# Patient Record
Sex: Female | Born: 1940 | Hispanic: No | Marital: Married | Smoking: Never smoker
Health system: Southern US, Community
[De-identification: ages and names within clinical notes are randomized; demographics above are authoritative.]

## PROBLEM LIST (undated history)

## (undated) DIAGNOSIS — M1990 Unspecified osteoarthritis, unspecified site: Secondary | ICD-10-CM

## (undated) DIAGNOSIS — E119 Type 2 diabetes mellitus without complications: Secondary | ICD-10-CM

## (undated) DIAGNOSIS — Z9289 Personal history of other medical treatment: Secondary | ICD-10-CM

## (undated) DIAGNOSIS — I10 Essential (primary) hypertension: Secondary | ICD-10-CM

## (undated) DIAGNOSIS — I639 Cerebral infarction, unspecified: Secondary | ICD-10-CM

## (undated) DIAGNOSIS — I739 Peripheral vascular disease, unspecified: Secondary | ICD-10-CM

## (undated) DIAGNOSIS — E7800 Pure hypercholesterolemia, unspecified: Secondary | ICD-10-CM

## (undated) HISTORY — PX: CATARACT EXTRACTION W/ INTRAOCULAR LENS  IMPLANT, BILATERAL: SHX1307

## (undated) HISTORY — PX: CHOLECYSTECTOMY: SHX55

## (undated) HISTORY — PX: TONSILLECTOMY: SUR1361

---

## 1970-07-24 HISTORY — PX: THYROID SURGERY: SHX805

## 1996-07-24 DIAGNOSIS — I639 Cerebral infarction, unspecified: Secondary | ICD-10-CM

## 1996-07-24 HISTORY — DX: Cerebral infarction, unspecified: I63.9

## 2001-10-04 ENCOUNTER — Ambulatory Visit (HOSPITAL_COMMUNITY): Admission: RE | Admit: 2001-10-04 | Discharge: 2001-10-04 | Payer: Self-pay | Admitting: Gastroenterology

## 2001-10-15 ENCOUNTER — Encounter: Admission: RE | Admit: 2001-10-15 | Discharge: 2001-10-15 | Payer: Self-pay | Admitting: Gastroenterology

## 2001-10-15 ENCOUNTER — Encounter: Payer: Self-pay | Admitting: Gastroenterology

## 2004-05-16 ENCOUNTER — Encounter: Admission: RE | Admit: 2004-05-16 | Discharge: 2004-05-16 | Payer: Self-pay | Admitting: Gastroenterology

## 2004-05-24 ENCOUNTER — Ambulatory Visit: Payer: Self-pay | Admitting: Cardiology

## 2004-06-02 ENCOUNTER — Ambulatory Visit (HOSPITAL_COMMUNITY): Admission: RE | Admit: 2004-06-02 | Discharge: 2004-06-02 | Payer: Self-pay | Admitting: General Surgery

## 2004-06-03 ENCOUNTER — Encounter (INDEPENDENT_AMBULATORY_CARE_PROVIDER_SITE_OTHER): Payer: Self-pay | Admitting: *Deleted

## 2004-06-03 ENCOUNTER — Inpatient Hospital Stay (HOSPITAL_COMMUNITY): Admission: RE | Admit: 2004-06-03 | Discharge: 2004-06-11 | Payer: Self-pay | Admitting: General Surgery

## 2004-06-06 ENCOUNTER — Encounter: Payer: Self-pay | Admitting: Cardiology

## 2004-06-14 ENCOUNTER — Ambulatory Visit: Payer: Self-pay | Admitting: Hematology and Oncology

## 2004-06-24 ENCOUNTER — Ambulatory Visit: Payer: Self-pay | Admitting: Cardiology

## 2004-07-21 ENCOUNTER — Ambulatory Visit: Payer: Self-pay

## 2004-08-15 ENCOUNTER — Encounter: Admission: RE | Admit: 2004-08-15 | Discharge: 2004-08-15 | Payer: Self-pay | Admitting: Hematology and Oncology

## 2004-10-12 ENCOUNTER — Ambulatory Visit (HOSPITAL_COMMUNITY): Admission: RE | Admit: 2004-10-12 | Discharge: 2004-10-12 | Payer: Self-pay | Admitting: Hematology and Oncology

## 2004-10-18 ENCOUNTER — Ambulatory Visit: Payer: Self-pay | Admitting: Hematology and Oncology

## 2005-02-07 ENCOUNTER — Ambulatory Visit: Payer: Self-pay | Admitting: Hematology and Oncology

## 2005-09-26 ENCOUNTER — Ambulatory Visit: Payer: Self-pay | Admitting: Hematology and Oncology

## 2005-10-02 ENCOUNTER — Ambulatory Visit (HOSPITAL_COMMUNITY): Admission: RE | Admit: 2005-10-02 | Discharge: 2005-10-02 | Payer: Self-pay | Admitting: Hematology and Oncology

## 2006-03-29 ENCOUNTER — Ambulatory Visit: Payer: Self-pay | Admitting: Hematology and Oncology

## 2007-02-22 ENCOUNTER — Ambulatory Visit: Payer: Self-pay | Admitting: Hematology and Oncology

## 2007-02-26 LAB — CBC WITH DIFFERENTIAL/PLATELET
BASO%: 0.5 % (ref 0.0–2.0)
HCT: 32.6 % — ABNORMAL LOW (ref 34.8–46.6)
HGB: 11.1 g/dL — ABNORMAL LOW (ref 11.6–15.9)
MCH: 27 pg (ref 26.0–34.0)
MCHC: 34.1 g/dL (ref 32.0–36.0)
MCV: 79.2 fL — ABNORMAL LOW (ref 81.0–101.0)
MONO%: 7.4 % (ref 0.0–13.0)
NEUT%: 56.3 % (ref 39.6–76.8)
RDW: 13.6 % (ref 11.3–14.5)
WBC: 6.9 10*3/uL (ref 3.9–10.0)

## 2007-02-26 LAB — COMPREHENSIVE METABOLIC PANEL
Creatinine, Ser: 0.81 mg/dL (ref 0.40–1.20)
Glucose, Bld: 149 mg/dL — ABNORMAL HIGH (ref 70–99)

## 2007-02-26 LAB — CEA: CEA: 1.1 ng/mL (ref 0.0–5.0)

## 2007-03-05 ENCOUNTER — Ambulatory Visit (HOSPITAL_COMMUNITY): Admission: RE | Admit: 2007-03-05 | Discharge: 2007-03-05 | Payer: Self-pay | Admitting: Hematology and Oncology

## 2007-11-15 ENCOUNTER — Ambulatory Visit: Payer: Self-pay | Admitting: Hematology and Oncology

## 2010-08-14 ENCOUNTER — Encounter: Payer: Self-pay | Admitting: Hematology and Oncology

## 2010-12-09 NOTE — Consult Note (Signed)
Brianna Hendricks, Brianna Hendricks        ACCOUNT NO.:  [id number]   MEDICAL RECORD NO.:  [id number]          PATIENT TYPE:  INP   LOCATION:  0358                         FACILITY:  [facility]   PHYSICIAN:  Olga Millers, M.D. LHCDATE OF BIRTH:  10-22-1940   DATE OF CONSULTATION:  06/05/2004  DATE OF DISCHARGE:                                   CONSULTATION   REASON FOR CONSULTATION:  Brianna Hendricks is a very pleasant 70 year old female  with a past medical history of diabetes mellitus, hypertension,  hyperlipidemia, and now status post abdominal surgery for colon cancer who  we are asked to evaluate for increased heart rate and positive troponin.  The patient has no prior cardiac history and typically denies any dyspnea on  exertion, orthopnea, PND, pedal edema, palpitations, presyncope, syncope, or  chest pain.  She underwent colon resection on June 03, 2004 secondary to  colon cancer.  Earlier this morning, she had elevated heart rate to 160.  In  reviewing the strips, this appears to be sinus tachycardia versus  supraventricular tachycardia.  She was placed on Cardizem and cardiology has  been asked to evaluate.  It has also been noted that her troponin I is  elevated at 0.42.  Also of note, the patient was asymptomatic with this  elevated heart rate including no chest pain, palpitations, or shortness of  breath.   MEDICATIONS:  1.  Glipizide ER 10 mg p.o. b.i.d.  2.  Metformin 500 mg p.o. b.i.d.  3.  Carbamazepine 200 mg p.o. b.i.d.  4.  Diovan/HCT 160/12.5 mg p.o. q.a.m. and 1/2 of that q.p.m.  5.  Iron and Astor 5 mg p.o. q.d.   ALLERGIES:  She has no known drug allergies.   SOCIAL HISTORY:  She does not smoke nor does she consume alcohol.   FAMILY HISTORY:  Positive for coronary artery disease in her sister.   PAST MEDICAL HISTORY:  1.  Significant for diabetes mellitus.  2.  Hypertension.  3.  Hyperlipidemia.  4.  She does have a history of hypothyroidism.  5.  History of  seizure disorder per the notes.   PAST SURGICAL HISTORY:  She is now status post resection of a colon.   REVIEW OF SYMPTOMS:  She denies any headaches, fevers, or chills.  There is  no productive cough or hemoptysis.  There no dysphagia, odynophagia, melena,  or hematochezia.  There is no dysuria or hematuria.  There is no rash or  seizure activity.  There is no orthopnea, PND, or pedal edema.  She did feel  somewhat short of breath this morning.  She does have cramping in the lower  extremities at night but no claudication.  The remaining systems are  negative.   PHYSICAL EXAMINATION:  VITAL SIGNS:  Blood pressure of 132/55, pulse is 113.  She is afebrile, although her temperature has been 100.  GENERAL:  She is well-developed, well-nourished in no acute distress.  SKIN:  Warm and dry.  There is no peripheral clubbing.  HEENT:  Unremarkable with normal eyelids.  NECK:  Supple with normal upstroke bilaterally.  There are no bruits.  There  is  no jugular venous distention and I cannot appreciate thyromegaly.  CHEST:  Minimal basilar crackles.  CARDIOVASCULAR:  Tachycardic rate but regular rhythm.  I cannot appreciate  murmurs, gallops, or rubs.  ABDOMEN:  She is status post abdominal surgery but the wound is without  evidence of infection.  There is no hepatosplenomegaly palpated.  I can  otherwise appreciate no masses but the bowel sounds are diminished.  She has  2+ femoral pulses bilaterally.  No bruits.  EXTREMITIES:  No edema and I can palpate no cords.  She has 2+ dorsalis  pedis pulses bilaterally.  NEUROLOGICAL:  Grossly intact.   LABORATORY DATA:  Her electrocardiogram at the time of admission showed  normal sinus rhythm with no ST changes and there was minimal criteria for  LVH.  With her elevated heart rate of 160, there is sinus tachycardia versus  SVT.  There are marked ST changes in the inferolateral distribution of  approximately 2 mm to 3 mm of ST depression.  Her  white blood cell count is  14.4 with a hemoglobin of 8.4 and hematocrit 25.3.  Platelet count is  322,000.  Her sodium is 130, potassium 4, BUN 4, and creatinine 0.8.  Her CK  is 163 with a MB of 3.5.  Her troponin I is elevated at 0.42.   DIAGNOSES:  1.  Tachycardia (sinus tachycardia versus supraventricular tachycardia).  2.  Mildly elevated troponin.  3.  Diabetes mellitus.  4.  Hypertension.  5.  Hyperlipidemia.  6.  Status post resection of colon cancer.  7.  History of seizure disorder.   PLAN:  Brianna Hendricks is for evaluation of increased heart rate.  In reviewing  the strips, this appears to be question of sinus tachycardia versus  supraventricular tachycardia.  This is most likely exacerbated by decrease  in p.o. intake, abdominal pain from her recent surgery, mild fever, and  hyperadrenergic state associated with the postoperative course.  We will  plan to discontinue her Cardizem and begin Lopressor.  We will increase as  tolerated.  Her mild increase troponin is of uncertain significance.  We  will continue to cycle to look for a trend.  However, her electrocardiogram  does show marked ST changes with her increased heart rate, and therefore she  has positive stress test as well as multiple risk factors.  I therefore feel  that cardiac catheterization is indicated after she recovers from her  surgery but prior to discharge.  She will discuss this with her family and  let us know.  I will check an echocardiogram and TSH tomorrow.  I would ask  surgery to add aspirin when they feel okay from a surgical standpoint.  We  will be happy to follow.      BC/MEDQ  D:  06/05/2004  T:  06/05/2004  Job:  [id number]

## 2010-12-09 NOTE — Procedures (Signed)
[place]  Patient:    Brianna Hendricks, Brianna Hendricks Visit Number: [id number] MRN: [id number]          Service Type: END Location: ENDO Attending Physician:  Charna Elizabeth Dictated by:   Anselmo Rod, M.D. Proc. Date: 10/04/01 Admit Date:  10/04/2001   CC:         Doreen Beam, M.D., [place], [place]   Procedure Report  DATE OF BIRTH:  [birth year]  PROCEDURE PERFORMED:  Colonoscopy.  ENDOSCOPIST:  Anselmo Rod, M.D.  INSTRUMENT USED:  Pediatric adjustable Olympus colonoscope.  INDICATION FOR PROCEDURE:  Iron deficiency anemia in a 70 year old [place] female; rule out colonic polyps, masses, hemorrhoids, etc.  Her EGD done earlier today was unrevealing.  PREPROCEDURE PREPARATION:  Informed consent was procured from the patient. The patient was fasted for eight hours prior to the procedure and prepped with a bottle of magnesium citrate and a gallon of NuLytely the night prior to the procedure.  PREPROCEDURE PHYSICAL:  VITAL SIGNS:  Patient had stable vital signs.  NECK:  Supple.  CHEST:  Clear to auscultation.  S1 and S2 regular.  ABDOMEN:  Soft with normal bowel sounds.  DESCRIPTION OF THE PROCEDURE:  The patient was placed in the left lateral decubitus position and sedated with Demerol and Versed for the EGD; no additional sedation was used for the colonoscopy.  Once the patient was adequately positioned and maintained on low-flow oxygen and continuous cardiac monitoring, the Olympus video colonoscope was advanced from the rectum to the cecum without difficulty.  The entire colonic mucosa appeared healthy with a normal vascular pattern.  No erosions, ulcerations, masses or polyps were seen.  The procedure completed up to the cecum.  The appendiceal orifice and ileocecal valve were clearly visualized and photographed.  IMPRESSION:  Normal-appearing colon.  RECOMMENDATION:  A small-bowel follow-through will be done if the patient  is significantly iron deficient, if not, iron supplementation is recommended with serial CBCs.Dictated by:   Anselmo Rod, M.D. Attending Physician:  Charna Elizabeth DD:  10/04/01 TD:  10/07/01 Job: [id number]

## 2010-12-09 NOTE — Op Note (Signed)
Brianna Hendricks, Brianna Hendricks        ACCOUNT NO.:  [id number]   MEDICAL RECORD NO.:  [id number]          PATIENT TYPE:  INP   LOCATION:  0472                         FACILITY:  [facility]   PHYSICIAN:  Jimmye Norman III, M.D.  DATE OF BIRTH:  June 29, 1941   DATE OF PROCEDURE:  06/03/2004  DATE OF DISCHARGE:                                 OPERATIVE REPORT   PREOPERATIVE DIAGNOSIS:  Right colon cancer.   POSTOPERATIVE DIAGNOSIS:  Right colon cancer.   PROCEDURE:  Right hemicolectomy.   SURGEON:  Jimmye Norman, M.D.   ASSISTANT:  Currie Paris, M.D.   ANESTHESIA:  General endotracheal.   ESTIMATED BLOOD LOSS:  100 cc.   COMPLICATIONS:  None.   CONDITION:  Stable.   INDICATIONS FOR PROCEDURE:  The patient is a 70 year old with recently  diagnosed anemia of unknown etiology until a colonoscopy was done,  demonstrating a nearly circumferential, constricting lesion of the right  colon, who now comes in for colectomy.   FINDINGS:  The patient had a constricting lesion of the hepatic flexure.  There was no evidence of direct invasion of local strictures or of  metastasis to the liver or spleen or other bowel structures.  The patient  did have some adhesions from previous surgery with small bowel into the  pelvis, which was easily removed.  She had a CT scan which demonstrated  possibly a small gallstones; however, none was palpated at the time of  surgery, and the gallbladder looked normal and therefore not removed.   OPERATION:  The patient was taken to the operating room and placed on the  table in the supine position.  After an adequate endotracheal anesthetic was  administered, she was prepped and draped in the usual sterile manner,  exposing the midline.   We started off with the midline incision just above the umbilicus and down  below the umbilicus to almost the pubic crest.  We extended it more  cranially after not having adequate exposure with the initial fascial  incision.  We then entered down to the midline fascia using electrocautery  and then into the peritoneal cavity using electrocautery through the fascia.  Once this was opened, we extended the fascia to the full extent of the skin  incision, placed a Balfour retractor in place and placed the patient in  slight reverse Trendelenburg with the left side tilted down.   Upon manual exploration, you could palpate the constricting lesion of the  hepatic flexure.  There was some dye into the wall of the serosa where it  had been injected to identify the actual lesion.  The right colon was  attached in its usual manner to the right paracolic area, and we took the  right colon down from the line of Toldt and mobilized it medially.  The  attachments towards the hepatic flexure were taken down between Kelly clamps  and 2-0 silk ties.  We reflected the colon downward medially by taking the  omentum down also and then transecting the colon just distal to the middle  colic vessels using a GIA-75 stapler.  We were able to take the mesentery  between [place] clamps and 2-0 silk ties with the larger vessels being doubly  tied.  The terminal ileum was transected with the GIA-75 stapler.  We did  have to mobilize adhesions of the small bowel to the pelvis prior to  transecting it; however, there was no injury and the anastomosis  subsequently was without event.   We stayed very close to the colon as we mobilized the right colon medially,  staying away from the retroperitoneal structures.  We irrigated after doing  our resection and anastomosis.  Again, the mesentery was taken between [place]  clamps and 2-0 silk ties.  Once this was done, we transected the bowel and  removed the specimen.   We did a side-to-side anastomosis between the terminal ileum and the left  transverse colon using a GIA-75 stapler.  A TA 60 was used to close the  resulting enterotomy, and the mesentery was closed using interrupted 2-0   silk sutures.  Once this was done, we changed the surgeon's gloves and then  irrigated with saline.  About 2 liters were used.  There was minimal  bleeding, although from the staple line, there was a small amount of oozing  on the external surface.  Internally, it did not ooze after the anastomosis  was done.   Once we had irrigated with saline, we ran the small bowel from the  anastomosis back to the ligament of Treitz and found there to be no evidence  of twisting.  We inspected, again, the area of the liver, at the  gallbladder, spleen, and stomach, and found no evidence of metastasis.  We  placed the omentum and the small bowel back into the abdominal cavity and  then closed the fascia using a running #1 PDS suture.  The skin, after  irrigating the subcu, was closed using stainless steel staples.  All needle  counts, instrument counts, and sponge counts were correct, and sterile  dressings were applied.      JW/MEDQ  D:  06/03/2004  T:  06/03/2004  Job:  [id number]

## 2010-12-09 NOTE — Discharge Summary (Signed)
Brianna Hendricks, ZEISER        ACCOUNT NO.:  [id number]   MEDICAL RECORD NO.:  [id number]          PATIENT TYPE:  INP   LOCATION:  0358                         [facility]ACILITY:  [facility]   PHYSICIAN:  Jimmye Norman III, M.D.  DATE OF BIRTH:  08-Feb-1941   DATE OF ADMISSION:  06/03/2004  DATE OF DISCHARGE:  06/11/2004                                 DISCHARGE SUMMARY   DISCHARGE DIAGNOSES:  1.  Adenocarcinoma of ascending colon.  2.  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  3.  Noninsulin dependent diabetes mellitus.  4.  Hypertension.   PRINCIPAL PROCEDURE:  1.  Right hemicolectomy.  2.  Cardiac catheterization with procedure being dictated by the      cardiologist, Dr. Antoine Poche.   DIET:  Regular.   CONDITION ON DISCHARGE:  Stable.  She did receive an oncology consultation  at follow-up also.   HOSPITAL COURSE:  The patient was admitted the day of her surgery, June 03, 2004, after an outpatient bowel prep.  She underwent a right  hemicolectomy.  Postoperatively that evening she did well; however, on  postoperative day #2 she had the acute onset of mild shortness of breath  with some tachycardia and she was transferred to a monitor unit and a  cardiology consultation was obtained for a heart rate up to 120-130.  She  had no shortness of breath or chest pain at that time; however, her  troponins did bump up slightly.  The CKs and MBs were negative.  She was  thought to have some possible mild ischemia.   She converted and came down with a Cardizem drip while on the monitor unit.  She was started on some additional medications to control her heart rate by  cardiology, including Lopressor 25 mg p.o. four times a day.  She was given  some Lasix.  Her preoperative medications were started back on postoperative  day #3.  At that time her abdomen was soft and nontender and she was  actually tolerating a clear liquid diet well.  She was advanced to full  liquid on postoperative day #5 and this was subsequently increased to a soft  diet by the time of discharge.   FOLLOW UP:  Per cardiology and by me was to take place within two weeks.  I  will see the patient on June 14, 2004.  She also had an appointment to  see the cardiologist and to get a stress test on December 29 at 9 a.m.      JW/MEDQ  D:  06/30/2004  T:  06/30/2004  Job:  [id number]

## 2010-12-09 NOTE — Cardiovascular Report (Signed)
Brianna Hendricks, Brianna Hendricks        ACCOUNT NO.:  [id number]   MEDICAL RECORD NO.:  [id number]          PATIENT TYPE:  OUT   LOCATION:  CATH                         FACILITY:  [facility]   PHYSICIAN:  Rollene Rotunda, M.D.   DATE OF BIRTH:  27-Jul-1940   DATE OF PROCEDURE:  06/09/2004  DATE OF DISCHARGE:                              CARDIAC CATHETERIZATION   PRIMARY CARE PHYSICIAN:  Dr. Doreen Beam   PROCEDURE:  Left heart catheterization/coronary arteriography.   INDICATIONS:  Evaluate patient with supraventricular tachycardia with ST  segment depression in infero and lateral leads with a slight troponin  elevation.   PROCEDURE NOTE:  Left heart catheterization was performed the via the right  femoral artery.  The artery was cannulated using anterior wall puncture.  A  #6-French arterial sheath was inserted via the modified Seldinger technique.  Preformed Judkins and a pigtail catheter were utilized.  The patient  tolerated procedure well and left the laboratory in stable condition.   RESULTS:  HEMODYNAMICS:  LV 191/21.  AO 189/81.   CORONARIES:  The left main was normal.   The LAD had a proximal 60-70% followed by mid 40% stenosis at a first septal  perforator.  The diagonal arising from this area was somewhat small and had  ostial 70-80% stenosis.  Second diagonal was moderate size with luminal  irregularities.   The circumflex in the AV groove had mid tandem 30% lesions.   The right coronary artery was a dominant vessel.  There were luminal  irregularities.  A PDA was moderate size and normal.   LEFT VENTRICULOGRAM:  The left ventriculogram was obtained in the RAO  projection.  The EF was 65% with normal wall motion.   CONCLUSION:  Patient has moderate left anterior descending stenosis.  There  is higher grade diagonal stenosis.  However, this is ostial in a somewhat  small vessel.  At this point I would manage this medically.  When she has  recovered from abdominal surgery I  would follow this with a stress perfusion  study.  If she has any symptoms going forward, would consider percutaneous  revascularization of the left anterior descending plus or minus the  diagonal.  She should have aggressive secondary risk reduction.       JH/MEDQ  D:  06/09/2004  T:  06/10/2004  Job:  [id number]   cc:   Doreen Beam  [address]  [place]  [place] [address]  Fax: [phone]   Anselmo Rod, M.D.  [address]  [address]  [place]  [place] [address]  Fax: [phone]

## 2010-12-09 NOTE — Procedures (Signed)
[place]. [facility]  Patient:    Brianna Hendricks, NUNCIO Visit Number: [id number] MRN: [id number]          Service Type: END Location: ENDO Attending Physician:  Charna Elizabeth Dictated by:   Anselmo Rod, M.D. Proc. Date: 10/04/01 Admit Date:  10/04/2001   CC:         Doreen Beam, M.D., [place], [place]   Procedure Report  DATE OF BIRTH:  1941/03/11.  PROCEDURE:  Esophagogastroduodenoscopy.  ENDOSCOPIST:  Anselmo Rod, M.D.  INSTRUMENT USED:  Olympus video panendoscope.  INDICATION FOR PROCEDURE:  Iron deficiency anemia in a 70 year old [place] female who has a history of diabetes.  Rule out peptic ulcer disease, esophagitis, gastritis, etc.  PREPROCEDURE PREPARATION:  Informed consent was procured from the patient. The patient was fasted for eight hours prior to the procedure.  PREPROCEDURE PHYSICAL:  VITAL SIGNS:  The patient had stable vital signs.  NECK:  Supple.  CHEST:  Clear to auscultation.  S1, S2 regular.  ABDOMEN:  Soft with normal bowel sounds.  DESCRIPTION OF PROCEDURE:  The patient was placed in the left lateral decubitus position and sedated with 80 mg of Demerol and 6 mg of Versed intravenously.  Once the patient was adequately sedate and maintained on low-flow oxygen and continuous cardiac monitoring, the Olympus video panendoscope was advanced through the mouthpiece, over the tongue, into the esophagus under direct vision.  The entire esophagus appeared normal and without lesions.  The scope was then advanced into the stomach.  The entire gastric mucosa and the proximal small bowel appeared normal.  IMPRESSION:  Normal EGD.  RECOMMENDATIONS:  Proceed with a colonoscopy at this time. Dictated by:   Anselmo Rod, M.D. Attending Physician:  Charna Elizabeth DD:  10/04/01 TD:  10/07/01 Job: [id number]

## 2012-04-11 ENCOUNTER — Other Ambulatory Visit: Payer: Self-pay | Admitting: Internal Medicine

## 2012-04-11 DIAGNOSIS — Z1231 Encounter for screening mammogram for malignant neoplasm of breast: Secondary | ICD-10-CM

## 2012-04-18 ENCOUNTER — Ambulatory Visit
Admission: RE | Admit: 2012-04-18 | Discharge: 2012-04-18 | Disposition: A | Discharge status: Home / Self Care | Payer: Medicare Other | Source: Ambulatory Visit | Attending: Internal Medicine | Admitting: Internal Medicine

## 2012-04-18 DIAGNOSIS — Z1231 Encounter for screening mammogram for malignant neoplasm of breast: Secondary | ICD-10-CM

## 2013-07-01 ENCOUNTER — Other Ambulatory Visit: Payer: Self-pay

## 2013-07-01 DIAGNOSIS — Z1231 Encounter for screening mammogram for malignant neoplasm of breast: Secondary | ICD-10-CM

## 2013-08-06 ENCOUNTER — Ambulatory Visit
Admission: RE | Admit: 2013-08-06 | Discharge: 2013-08-06 | Disposition: A | Discharge status: Home / Self Care | Payer: Medicare Other | Source: Ambulatory Visit

## 2013-08-06 DIAGNOSIS — Z1231 Encounter for screening mammogram for malignant neoplasm of breast: Secondary | ICD-10-CM

## 2015-04-09 ENCOUNTER — Other Ambulatory Visit: Payer: Self-pay

## 2015-04-09 DIAGNOSIS — Z1231 Encounter for screening mammogram for malignant neoplasm of breast: Secondary | ICD-10-CM

## 2015-04-13 ENCOUNTER — Ambulatory Visit: Payer: Medicare Other

## 2015-04-13 ENCOUNTER — Ambulatory Visit
Admission: RE | Admit: 2015-04-13 | Discharge: 2015-04-13 | Disposition: A | Discharge status: Home / Self Care | Payer: Medicare Other | Source: Ambulatory Visit

## 2015-04-13 DIAGNOSIS — Z1231 Encounter for screening mammogram for malignant neoplasm of breast: Secondary | ICD-10-CM

## 2015-08-24 DIAGNOSIS — D509 Iron deficiency anemia, unspecified: Secondary | ICD-10-CM | POA: Diagnosis not present

## 2015-08-24 DIAGNOSIS — M1990 Unspecified osteoarthritis, unspecified site: Secondary | ICD-10-CM | POA: Diagnosis not present

## 2015-08-24 DIAGNOSIS — E1165 Type 2 diabetes mellitus with hyperglycemia: Secondary | ICD-10-CM | POA: Diagnosis not present

## 2015-08-24 DIAGNOSIS — Z6820 Body mass index (BMI) 20.0-20.9, adult: Secondary | ICD-10-CM | POA: Diagnosis not present

## 2015-08-24 DIAGNOSIS — E7800 Pure hypercholesterolemia, unspecified: Secondary | ICD-10-CM | POA: Diagnosis not present

## 2015-10-27 DIAGNOSIS — M159 Polyosteoarthritis, unspecified: Secondary | ICD-10-CM | POA: Diagnosis not present

## 2015-10-27 DIAGNOSIS — E7800 Pure hypercholesterolemia, unspecified: Secondary | ICD-10-CM | POA: Diagnosis not present

## 2015-10-27 DIAGNOSIS — I10 Essential (primary) hypertension: Secondary | ICD-10-CM | POA: Diagnosis not present

## 2015-10-27 DIAGNOSIS — E119 Type 2 diabetes mellitus without complications: Secondary | ICD-10-CM | POA: Diagnosis not present

## 2015-11-01 DIAGNOSIS — R5383 Other fatigue: Secondary | ICD-10-CM | POA: Diagnosis not present

## 2015-11-01 DIAGNOSIS — Z79899 Other long term (current) drug therapy: Secondary | ICD-10-CM | POA: Diagnosis not present

## 2015-11-01 DIAGNOSIS — Z7189 Other specified counseling: Secondary | ICD-10-CM | POA: Diagnosis not present

## 2015-11-01 DIAGNOSIS — R0789 Other chest pain: Secondary | ICD-10-CM | POA: Diagnosis not present

## 2015-11-01 DIAGNOSIS — Z299 Encounter for prophylactic measures, unspecified: Secondary | ICD-10-CM | POA: Diagnosis not present

## 2015-11-01 DIAGNOSIS — E7800 Pure hypercholesterolemia, unspecified: Secondary | ICD-10-CM | POA: Diagnosis not present

## 2015-11-01 DIAGNOSIS — Z0000 Encounter for general adult medical examination without abnormal findings: Secondary | ICD-10-CM | POA: Diagnosis not present

## 2015-11-01 DIAGNOSIS — Z1389 Encounter for screening for other disorder: Secondary | ICD-10-CM | POA: Diagnosis not present

## 2015-11-23 DIAGNOSIS — E1165 Type 2 diabetes mellitus with hyperglycemia: Secondary | ICD-10-CM | POA: Diagnosis not present

## 2015-11-23 DIAGNOSIS — Z789 Other specified health status: Secondary | ICD-10-CM | POA: Diagnosis not present

## 2015-11-23 DIAGNOSIS — R252 Cramp and spasm: Secondary | ICD-10-CM | POA: Diagnosis not present

## 2015-11-23 DIAGNOSIS — M545 Low back pain: Secondary | ICD-10-CM | POA: Diagnosis not present

## 2015-11-23 DIAGNOSIS — D509 Iron deficiency anemia, unspecified: Secondary | ICD-10-CM | POA: Diagnosis not present

## 2015-12-10 DIAGNOSIS — Z961 Presence of intraocular lens: Secondary | ICD-10-CM | POA: Diagnosis not present

## 2015-12-10 DIAGNOSIS — H31092 Other chorioretinal scars, left eye: Secondary | ICD-10-CM | POA: Diagnosis not present

## 2015-12-10 DIAGNOSIS — H1851 Endothelial corneal dystrophy: Secondary | ICD-10-CM | POA: Diagnosis not present

## 2015-12-10 DIAGNOSIS — E113293 Type 2 diabetes mellitus with mild nonproliferative diabetic retinopathy without macular edema, bilateral: Secondary | ICD-10-CM | POA: Diagnosis not present

## 2016-01-19 DIAGNOSIS — E119 Type 2 diabetes mellitus without complications: Secondary | ICD-10-CM | POA: Diagnosis not present

## 2016-01-19 DIAGNOSIS — E7800 Pure hypercholesterolemia, unspecified: Secondary | ICD-10-CM | POA: Diagnosis not present

## 2016-01-19 DIAGNOSIS — I10 Essential (primary) hypertension: Secondary | ICD-10-CM | POA: Diagnosis not present

## 2016-01-19 DIAGNOSIS — M159 Polyosteoarthritis, unspecified: Secondary | ICD-10-CM | POA: Diagnosis not present

## 2016-02-08 DIAGNOSIS — M159 Polyosteoarthritis, unspecified: Secondary | ICD-10-CM | POA: Diagnosis not present

## 2016-02-08 DIAGNOSIS — E119 Type 2 diabetes mellitus without complications: Secondary | ICD-10-CM | POA: Diagnosis not present

## 2016-02-08 DIAGNOSIS — E7800 Pure hypercholesterolemia, unspecified: Secondary | ICD-10-CM | POA: Diagnosis not present

## 2016-02-08 DIAGNOSIS — I10 Essential (primary) hypertension: Secondary | ICD-10-CM | POA: Diagnosis not present

## 2016-02-29 DIAGNOSIS — E1165 Type 2 diabetes mellitus with hyperglycemia: Secondary | ICD-10-CM | POA: Diagnosis not present

## 2016-02-29 DIAGNOSIS — E7800 Pure hypercholesterolemia, unspecified: Secondary | ICD-10-CM | POA: Diagnosis not present

## 2016-02-29 DIAGNOSIS — I10 Essential (primary) hypertension: Secondary | ICD-10-CM | POA: Diagnosis not present

## 2016-02-29 DIAGNOSIS — E1159 Type 2 diabetes mellitus with other circulatory complications: Secondary | ICD-10-CM | POA: Diagnosis not present

## 2016-06-06 DIAGNOSIS — Z713 Dietary counseling and surveillance: Secondary | ICD-10-CM | POA: Diagnosis not present

## 2016-06-06 DIAGNOSIS — Z6821 Body mass index (BMI) 21.0-21.9, adult: Secondary | ICD-10-CM | POA: Diagnosis not present

## 2016-06-06 DIAGNOSIS — I10 Essential (primary) hypertension: Secondary | ICD-10-CM | POA: Diagnosis not present

## 2016-06-06 DIAGNOSIS — E1165 Type 2 diabetes mellitus with hyperglycemia: Secondary | ICD-10-CM | POA: Diagnosis not present

## 2016-06-06 DIAGNOSIS — Z299 Encounter for prophylactic measures, unspecified: Secondary | ICD-10-CM | POA: Diagnosis not present

## 2016-07-14 DIAGNOSIS — E1165 Type 2 diabetes mellitus with hyperglycemia: Secondary | ICD-10-CM | POA: Diagnosis not present

## 2016-07-14 DIAGNOSIS — I10 Essential (primary) hypertension: Secondary | ICD-10-CM | POA: Diagnosis not present

## 2016-08-08 DIAGNOSIS — E1165 Type 2 diabetes mellitus with hyperglycemia: Secondary | ICD-10-CM | POA: Diagnosis not present

## 2016-08-08 DIAGNOSIS — Z299 Encounter for prophylactic measures, unspecified: Secondary | ICD-10-CM | POA: Diagnosis not present

## 2016-08-08 DIAGNOSIS — Z6821 Body mass index (BMI) 21.0-21.9, adult: Secondary | ICD-10-CM | POA: Diagnosis not present

## 2016-08-08 DIAGNOSIS — I10 Essential (primary) hypertension: Secondary | ICD-10-CM | POA: Diagnosis not present

## 2016-08-08 DIAGNOSIS — Z713 Dietary counseling and surveillance: Secondary | ICD-10-CM | POA: Diagnosis not present

## 2016-08-23 DIAGNOSIS — E7800 Pure hypercholesterolemia, unspecified: Secondary | ICD-10-CM | POA: Diagnosis not present

## 2016-08-23 DIAGNOSIS — E119 Type 2 diabetes mellitus without complications: Secondary | ICD-10-CM | POA: Diagnosis not present

## 2016-08-23 DIAGNOSIS — I10 Essential (primary) hypertension: Secondary | ICD-10-CM | POA: Diagnosis not present

## 2016-08-23 DIAGNOSIS — M159 Polyosteoarthritis, unspecified: Secondary | ICD-10-CM | POA: Diagnosis not present

## 2016-10-19 DIAGNOSIS — M159 Polyosteoarthritis, unspecified: Secondary | ICD-10-CM | POA: Diagnosis not present

## 2016-10-19 DIAGNOSIS — I10 Essential (primary) hypertension: Secondary | ICD-10-CM | POA: Diagnosis not present

## 2016-10-19 DIAGNOSIS — E7800 Pure hypercholesterolemia, unspecified: Secondary | ICD-10-CM | POA: Diagnosis not present

## 2016-10-19 DIAGNOSIS — E119 Type 2 diabetes mellitus without complications: Secondary | ICD-10-CM | POA: Diagnosis not present

## 2016-11-07 DIAGNOSIS — M1990 Unspecified osteoarthritis, unspecified site: Secondary | ICD-10-CM | POA: Diagnosis not present

## 2016-11-07 DIAGNOSIS — E1165 Type 2 diabetes mellitus with hyperglycemia: Secondary | ICD-10-CM | POA: Diagnosis not present

## 2016-11-07 DIAGNOSIS — I10 Essential (primary) hypertension: Secondary | ICD-10-CM | POA: Diagnosis not present

## 2016-11-07 DIAGNOSIS — Z299 Encounter for prophylactic measures, unspecified: Secondary | ICD-10-CM | POA: Diagnosis not present

## 2016-11-07 DIAGNOSIS — Z6821 Body mass index (BMI) 21.0-21.9, adult: Secondary | ICD-10-CM | POA: Diagnosis not present

## 2016-12-15 DIAGNOSIS — H1851 Endothelial corneal dystrophy: Secondary | ICD-10-CM | POA: Diagnosis not present

## 2016-12-15 DIAGNOSIS — H31092 Other chorioretinal scars, left eye: Secondary | ICD-10-CM | POA: Diagnosis not present

## 2016-12-15 DIAGNOSIS — Z961 Presence of intraocular lens: Secondary | ICD-10-CM | POA: Diagnosis not present

## 2016-12-15 DIAGNOSIS — E113293 Type 2 diabetes mellitus with mild nonproliferative diabetic retinopathy without macular edema, bilateral: Secondary | ICD-10-CM | POA: Diagnosis not present

## 2016-12-20 DIAGNOSIS — Z0000 Encounter for general adult medical examination without abnormal findings: Secondary | ICD-10-CM | POA: Diagnosis not present

## 2016-12-20 DIAGNOSIS — E049 Nontoxic goiter, unspecified: Secondary | ICD-10-CM | POA: Diagnosis not present

## 2016-12-20 DIAGNOSIS — E1165 Type 2 diabetes mellitus with hyperglycemia: Secondary | ICD-10-CM | POA: Diagnosis not present

## 2016-12-20 DIAGNOSIS — Z1211 Encounter for screening for malignant neoplasm of colon: Secondary | ICD-10-CM | POA: Diagnosis not present

## 2016-12-20 DIAGNOSIS — Z6821 Body mass index (BMI) 21.0-21.9, adult: Secondary | ICD-10-CM | POA: Diagnosis not present

## 2016-12-20 DIAGNOSIS — Z299 Encounter for prophylactic measures, unspecified: Secondary | ICD-10-CM | POA: Diagnosis not present

## 2016-12-20 DIAGNOSIS — I10 Essential (primary) hypertension: Secondary | ICD-10-CM | POA: Diagnosis not present

## 2016-12-20 DIAGNOSIS — Z79899 Other long term (current) drug therapy: Secondary | ICD-10-CM | POA: Diagnosis not present

## 2016-12-20 DIAGNOSIS — Z7189 Other specified counseling: Secondary | ICD-10-CM | POA: Diagnosis not present

## 2016-12-20 DIAGNOSIS — E8940 Asymptomatic postprocedural ovarian failure: Secondary | ICD-10-CM | POA: Diagnosis not present

## 2016-12-20 DIAGNOSIS — R5383 Other fatigue: Secondary | ICD-10-CM | POA: Diagnosis not present

## 2016-12-20 DIAGNOSIS — Z1389 Encounter for screening for other disorder: Secondary | ICD-10-CM | POA: Diagnosis not present

## 2016-12-20 DIAGNOSIS — E7800 Pure hypercholesterolemia, unspecified: Secondary | ICD-10-CM | POA: Diagnosis not present

## 2017-01-05 DIAGNOSIS — I10 Essential (primary) hypertension: Secondary | ICD-10-CM | POA: Diagnosis not present

## 2017-01-05 DIAGNOSIS — R5383 Other fatigue: Secondary | ICD-10-CM | POA: Diagnosis not present

## 2017-01-05 DIAGNOSIS — D509 Iron deficiency anemia, unspecified: Secondary | ICD-10-CM | POA: Diagnosis not present

## 2017-01-05 DIAGNOSIS — Z6821 Body mass index (BMI) 21.0-21.9, adult: Secondary | ICD-10-CM | POA: Diagnosis not present

## 2017-01-05 DIAGNOSIS — Z79899 Other long term (current) drug therapy: Secondary | ICD-10-CM | POA: Diagnosis not present

## 2017-01-05 DIAGNOSIS — D500 Iron deficiency anemia secondary to blood loss (chronic): Secondary | ICD-10-CM | POA: Diagnosis not present

## 2017-01-05 DIAGNOSIS — Z299 Encounter for prophylactic measures, unspecified: Secondary | ICD-10-CM | POA: Diagnosis not present

## 2017-01-05 DIAGNOSIS — Z713 Dietary counseling and surveillance: Secondary | ICD-10-CM | POA: Diagnosis not present

## 2017-01-05 DIAGNOSIS — R42 Dizziness and giddiness: Secondary | ICD-10-CM | POA: Diagnosis not present

## 2017-01-05 DIAGNOSIS — E1165 Type 2 diabetes mellitus with hyperglycemia: Secondary | ICD-10-CM | POA: Diagnosis not present

## 2017-01-05 DIAGNOSIS — M1990 Unspecified osteoarthritis, unspecified site: Secondary | ICD-10-CM | POA: Diagnosis not present

## 2017-03-28 DIAGNOSIS — Z6820 Body mass index (BMI) 20.0-20.9, adult: Secondary | ICD-10-CM | POA: Diagnosis not present

## 2017-03-28 DIAGNOSIS — E7800 Pure hypercholesterolemia, unspecified: Secondary | ICD-10-CM | POA: Diagnosis not present

## 2017-03-28 DIAGNOSIS — Z299 Encounter for prophylactic measures, unspecified: Secondary | ICD-10-CM | POA: Diagnosis not present

## 2017-03-28 DIAGNOSIS — E1165 Type 2 diabetes mellitus with hyperglycemia: Secondary | ICD-10-CM | POA: Diagnosis not present

## 2017-03-28 DIAGNOSIS — I10 Essential (primary) hypertension: Secondary | ICD-10-CM | POA: Diagnosis not present

## 2017-07-09 DIAGNOSIS — E1165 Type 2 diabetes mellitus with hyperglycemia: Secondary | ICD-10-CM | POA: Diagnosis not present

## 2017-07-09 DIAGNOSIS — Z789 Other specified health status: Secondary | ICD-10-CM | POA: Diagnosis not present

## 2017-07-09 DIAGNOSIS — Z299 Encounter for prophylactic measures, unspecified: Secondary | ICD-10-CM | POA: Diagnosis not present

## 2017-07-09 DIAGNOSIS — Z6820 Body mass index (BMI) 20.0-20.9, adult: Secondary | ICD-10-CM | POA: Diagnosis not present

## 2017-07-09 DIAGNOSIS — I10 Essential (primary) hypertension: Secondary | ICD-10-CM | POA: Diagnosis not present

## 2017-07-09 DIAGNOSIS — Z713 Dietary counseling and surveillance: Secondary | ICD-10-CM | POA: Diagnosis not present

## 2017-11-06 DIAGNOSIS — I10 Essential (primary) hypertension: Secondary | ICD-10-CM | POA: Diagnosis not present

## 2017-11-06 DIAGNOSIS — Z6820 Body mass index (BMI) 20.0-20.9, adult: Secondary | ICD-10-CM | POA: Diagnosis not present

## 2017-11-06 DIAGNOSIS — D509 Iron deficiency anemia, unspecified: Secondary | ICD-10-CM | POA: Diagnosis not present

## 2017-11-06 DIAGNOSIS — M79605 Pain in left leg: Secondary | ICD-10-CM | POA: Diagnosis not present

## 2017-11-06 DIAGNOSIS — E1165 Type 2 diabetes mellitus with hyperglycemia: Secondary | ICD-10-CM | POA: Diagnosis not present

## 2017-11-06 DIAGNOSIS — Z299 Encounter for prophylactic measures, unspecified: Secondary | ICD-10-CM | POA: Diagnosis not present

## 2017-11-19 DIAGNOSIS — I70211 Atherosclerosis of native arteries of extremities with intermittent claudication, right leg: Secondary | ICD-10-CM | POA: Diagnosis not present

## 2017-11-19 DIAGNOSIS — M79605 Pain in left leg: Secondary | ICD-10-CM | POA: Diagnosis not present

## 2017-11-29 DIAGNOSIS — I447 Left bundle-branch block, unspecified: Secondary | ICD-10-CM | POA: Diagnosis not present

## 2017-11-29 DIAGNOSIS — I739 Peripheral vascular disease, unspecified: Secondary | ICD-10-CM | POA: Diagnosis not present

## 2017-11-29 DIAGNOSIS — E1151 Type 2 diabetes mellitus with diabetic peripheral angiopathy without gangrene: Secondary | ICD-10-CM | POA: Diagnosis not present

## 2017-11-29 DIAGNOSIS — E782 Mixed hyperlipidemia: Secondary | ICD-10-CM | POA: Diagnosis not present

## 2017-12-12 DIAGNOSIS — I739 Peripheral vascular disease, unspecified: Secondary | ICD-10-CM | POA: Diagnosis not present

## 2017-12-19 DIAGNOSIS — H1851 Endothelial corneal dystrophy: Secondary | ICD-10-CM | POA: Diagnosis not present

## 2017-12-19 DIAGNOSIS — H31092 Other chorioretinal scars, left eye: Secondary | ICD-10-CM | POA: Diagnosis not present

## 2017-12-19 DIAGNOSIS — E113293 Type 2 diabetes mellitus with mild nonproliferative diabetic retinopathy without macular edema, bilateral: Secondary | ICD-10-CM | POA: Diagnosis not present

## 2017-12-19 DIAGNOSIS — Z961 Presence of intraocular lens: Secondary | ICD-10-CM | POA: Diagnosis not present

## 2017-12-21 DIAGNOSIS — R0989 Other specified symptoms and signs involving the circulatory and respiratory systems: Secondary | ICD-10-CM | POA: Diagnosis not present

## 2017-12-24 DIAGNOSIS — I739 Peripheral vascular disease, unspecified: Secondary | ICD-10-CM

## 2017-12-24 NOTE — H&P (Signed)
OFFICE VISIT NOTES COPIED TO EPIC FOR DOCUMENTATION  . History of Present Illness Brianna Page MD; 12/01/2017 2:46 PM) Patient words: NP EVAL for left side abn circulation - Pt last seen here in 2014.  The patient is a 77 year old female who presents with peripheral vascular disease. Mrs. Vincenzina Jagoda is a [place] female with history of chronic LBBB, hypertension, hyperlipidemia, diabetes mellitus who was referred to me for evaluation of peripheral arterial disease. I had seen her in 2014 and she had normal nuclear stress and echocardiogram had revealed low normal LVEF. Marland Kitchen  She has been complaining of bilateral leg pain in the form of weakness in the legs and difficulty in weight bearing, left leg worse than the right. Symptoms have been progressively getting worse, worse in the last 2 weeks.  She underwent lower extremity arterial duplex on 11/19/2017 that revealed normal ABI on the right, mildly decreased ABI on the left at 0.7 with biphasic waveforms throughout the left lower extremity suggestive of aortoiliac inflow disease.   Problem List/Past Medical (April Louretta Shorten; 10-Dec-2017 1:38 PM) Carotid bruit (R09.89)  Hyperlipidemia (E78.5)  Diabetes mellitus type II, controlled (E11.9)  LBBB (left bundle branch block) (I44.7)  04/08/2013: Normal sinus rhythm, heart rate 69 bpm, leftward axis. LBBB. No further analysis due to underlying left bundle branch block. Benign essential hypertension (I10)  H/O: stroke (Z86.73) [1999]: 1999: Left hemiparesis with complete resolution.  Allergies (April Garrison; 12-10-2017 1:38 PM) No Known Drug Allergies [04/08/2013]:  Family History (April Louretta Shorten; 12-10-17 1:45 PM) Father  Deceased. at age 29 from a MI. Siblings  6 (5 living)-HTN Mother  Deceased. at age 6 from old age-had Hypertension.  Social History (April Garrison; 10-Dec-2017 1:45 PM) Marital status  Married. Number of Children  2. Non Drinker/No Alcohol Use   Current tobacco use  Never smoker. Living Situation  Lives with spouse.  Past Surgical History (April Garrison; 12/10/17 1:38 PM) Tonsillectomy [1960]: Thyroidectomy; Left [1973]: Cholecystectomy [2010]:  Medication History Brianna Page, MD; Dec 10, 2017 2:55 PM) AmLODIPine Besylate ('10MG'$  Tablet, 1 Oral daily, Taken starting 04/08/2013) Active. Losartan Potassium ('50MG'$  Tablet, 1 Oral daily) Active. CarBAMazepine ('200MG'$  Tablet, 1 Oral two times daily) Active. Atorvastatin Calcium ('10MG'$  Tablet, 1 (One) Oral daily) Active. (Increase to 10 mg Dec 10, 2017:) MetFORMIN HCl ('500MG'$  Tablet, 1 Oral two times daily) Active. Aspirin ('81MG'$  Tablet, 1 Oral daily) Active. Folic Acid ('1MG'$  Tablet, 1 Oral daily) Active. Calcium-Vitamin D3 600 mg (1 two times daily) Active. Vitamin D3 (1000UNIT Capsule, 1 Oral daily) Active. Nebivolol HCl (2.'5MG'$  Tablet, 1 Oral two times daily) Active. Jardiance ('10MG'$  Tablet, 1/2 Oral two times daily) Active. Ferrous Sulfate (325 (65 Fe)MG Tablet, 1 Oral two times daily) Active. Lyrica ('75MG'$  Capsule, 1 Oral two times daily) Active.  Diagnostic Studies History (April Garrison; 2017-12-10 1:56 PM) Colonoscopy [2004]: pre-cancerous polyps removed?? (blood transfusion) Endoscopy [2017]: Normal. Lower Extremity Dopplers [11/19/2017]: at PCP  Other Problems (April Garrison; 12/10/2017 1:38 PM) Echocardiogram findings abnormal, without diagnosis (R93.1) [04/05/2013]: Echo- 04/24/13 1.Left ventricular cavity is normal in size. Abnormal septal motion due to IVCD. Lower limits systolic global function. Calculated EF 55%. Visual EF is 50-55%. Doppler evidence of Grade I (impaired) diastolic dysfunction. 2.Left atrial cavity is slightly dilated. 3.Mild calcification of the mitral annulus. Trace mitral regurgitation. Mitral valve inflow A > E ratio. 4.Tricuspid valve structurally normal. Mild tricuspid regurgitation. Abnormal nuclear stress test (R94.39)  Echo-  04/24/13 1.Left ventricular cavity is normal in size. Abnormal septal motion due to IVCD. Lower  limits systolic global function. Calculated EF 55%. Visual EF is 50-55%. Doppler evidence of Grade I (impaired) diastolic dysfunction. 2.Left atrial cavity is slightly dilated. 3.Mild calcification of the mitral annulus. Trace mitral regurgitation. Mitral valve inflow A > E ratio. 4.Tricuspid valve structurally normal. Mild tricuspid regurgitation.    Review of Systems Brianna Page MD; 12/01/2017 2:50 PM) General Present- Feeling well. Not Present- Fatigue, Fever and Night Sweats. Skin Not Present- Itching and Rash. HEENT Not Present- Headache. Respiratory Not Present- Difficulty Breathing. Cardiovascular Present- Claudications (left leg). Not Present- Fainting, Orthopnea and Swelling of Extremities. Gastrointestinal Not Present- Abdominal Pain, Constipation, Diarrhea, Nausea and Vomiting. Musculoskeletal Not Present- Joint Swelling. Neurological Not Present- Headaches. Hematology Not Present- Blood Clots, Easy Bruising and Nose Bleed.  Vitals (April Garrison; 11/29/2017 1:56 PM) 11/29/2017 1:40 PM Weight: 109.44 lb Height: 58in Body Surface Area: 1.41 m Body Mass Index: 22.87 kg/m  Pulse: 64 (Regular)  P.OX: 97% (Room air) BP: 138/63 (Sitting, Left Arm, Standard)       Physical Exam Brianna Page MD; 12/01/2017 2:50 PM) General Mental Status-Alert. General Appearance-Cooperative, Appears stated age, Not in acute distress. Build & Nutrition-Moderately built.  Head and Neck Thyroid Gland Characteristics - no palpable nodules, no palpable enlargement.  Chest and Lung Exam Chest and lung exam reveals -quiet, even and easy respiratory effort with no use of accessory muscles and on auscultation, normal breath sounds, no adventitious sounds.  Cardiovascular Cardiovascular examination reveals -normal heart sounds, regular rate and rhythm with no  murmurs.  Abdomen Palpation/Percussion Palpation and Percussion of the abdomen reveal - Non Tender and No hepatosplenomegaly. Auscultation Auscultation of the abdomen reveals - Bowel sounds normal.  Peripheral Vascular Lower Extremity Inspection - Bilateral - Inspection Normal. Palpation - Temperature - Left - Cool. Right - Normal. Edema - Bilateral - No edema. Femoral pulse - Left - 2+(bruit). Right - Normal. Popliteal pulse - Left - Absent. Right - Normal. Dorsalis pedis pulse - Left - Absent. Right - 2+. Posterior tibial pulse - Left - Absent. Right - 2+. Carotid arteries - Bilateral-Soft Bruit. Abdomen-No prominent abdominal aortic pulsation, No epigastric bruit.  Neurologic Neurologic evaluation reveals -alert and oriented x 3 with no impairment of recent or remote memory. Motor-Grossly intact without any focal deficits.  Musculoskeletal Global Assessment Left Lower Extremity - normal range of motion without pain. Right Lower Extremity - normal range of motion without pain.    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 Current Plans Started Clopidogrel Bisulfate 75MG, 1 (one) Tablet daily, #30, 30 days starting 11/29/2017, Ref. x2. 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 No further analysis due to LBBB No significant change from 04/08/2013.  Lexiscan Myoview stress test 04/25/2013: 1. Resting EKG NSR, LBBB. Stress EKG was non diagnostic for ischemia. No ST-T changes of ischemia  noted with pharmacologic stress testing. Stress symptoms included chest pressure, sob, stomach discomfort and headache. Stress terminated due to completion of protocol. 2. The perfusion study demonstrated mild breast attenuation artifact in the anterior wall. There was no evidence of ischemia or scar. Dynamic gated  images reveal normal wall motion and endocardial thickening. Left ventricular ejection fraction was estimated to be 69%. This represents a low risk study. Impression: Echo- 04/24/13 1. Left ventricular cavity is normal in size. Abnormal septal motion due to IVCD. Lower limits systolic global function. Calculated EF 55%. Visual EF is 50-55%. Doppler evidence of Grade I (impaired) diastolic dysfunction. 2. Left atrial cavity is slightly dilated. 3. Mild calcification of the mitral annulus. Trace mitral regurgitation. Mitral valve inflow A > E ratio. 4. Tricuspid valve structurally normal. Mild tricuspid regurgitation. 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 Antegrade right vertebral artery flow. Antegrade left vertebral artery flow. Follow up in one year or if clinically indicated. No significant change from 05/01/2013.  Laboratory examination (Z01.89) Story: 12/13/2017: Glucose 211, creatinine 0.86, EGFR 67/77, potassium 5.3, BMP otherwise normal.  CBC normal.  12/20/2016: Cholesterol 178, triglycerides 315, HDL 55, LDL 60. TSH 2.9. Hemoglobin 9.2, hematocrit 29.2, microcytic indices, CBC otherwise normal. Creatinine 0.7, EGFR 86/99, potassium 5.4, CMP otherwise normal.  Note:-  Recommendations:  Mrs. Lelaina Oatis is a [place] female with history of chronic LBBB, hypertension, hyperlipidemia, diabetes mellitus who was referred to me for evaluation of peripheral arterial disease. I had seen her in 2014 and she had  normal nuclear stress and echocardiogram had revealed low normal LVEF.  She has been complaining of bilateral leg pain in the form of weakness in the legs and difficulty in weight bearing, left leg worse than the right. Symptoms have been progressively getting worse, worse in the last 2 weeks.  Patient with diabetes mellitus, now presenting with severe lifestyle limiting claudication, also the left leg is cooler than the right, I'm concerned that she may have acutely progressed her PAD in the past 2 weeks. I started her on appropriate drill 0.5 mg p.o. q. daily. I will set her up for peripheral angiography and possible angioplasty. She also needs carotid artery duplex for bruit and prior stroke. Diabetes is well controlled. I will advised her to increase Lipitor from 5 mg to 10 mg for now, she probably will need much higher dose. I'll evaluate the labs for making the changes. I'll see her back after peripheral angiography I will discontinue Baclofen due to dizziness. She also has mixed hyperlipidemia probably due to uncontrolled DM. I will consider starting Jardiance on her next OV.  CC: Dr. Jerene Bears.    Signed by Brianna Page, MD (12/01/2017 2:56 PM)

## 2017-12-25 ENCOUNTER — Encounter (HOSPITAL_COMMUNITY): Payer: Self-pay | Admitting: Cardiology

## 2017-12-25 ENCOUNTER — Other Ambulatory Visit: Payer: Self-pay

## 2017-12-25 ENCOUNTER — Ambulatory Visit (HOSPITAL_COMMUNITY)
Admission: RE | Disposition: A | Discharge status: Home / Self Care | Payer: Self-pay | Source: Ambulatory Visit | Attending: Cardiology

## 2017-12-25 ENCOUNTER — Observation Stay (HOSPITAL_COMMUNITY)
Admission: RE | Admit: 2017-12-25 | Discharge: 2017-12-26 | Disposition: A | Discharge status: Home / Self Care | Payer: Medicare Other | Source: Ambulatory Visit | Attending: Cardiology | Admitting: Cardiology

## 2017-12-25 DIAGNOSIS — Z7984 Long term (current) use of oral hypoglycemic drugs: Secondary | ICD-10-CM | POA: Insufficient documentation

## 2017-12-25 DIAGNOSIS — I10 Essential (primary) hypertension: Secondary | ICD-10-CM | POA: Diagnosis not present

## 2017-12-25 DIAGNOSIS — E1151 Type 2 diabetes mellitus with diabetic peripheral angiopathy without gangrene: Secondary | ICD-10-CM | POA: Insufficient documentation

## 2017-12-25 DIAGNOSIS — Z7982 Long term (current) use of aspirin: Secondary | ICD-10-CM | POA: Insufficient documentation

## 2017-12-25 DIAGNOSIS — I70212 Atherosclerosis of native arteries of extremities with intermittent claudication, left leg: Secondary | ICD-10-CM | POA: Diagnosis not present

## 2017-12-25 DIAGNOSIS — Z79899 Other long term (current) drug therapy: Secondary | ICD-10-CM | POA: Diagnosis not present

## 2017-12-25 DIAGNOSIS — Z9889 Other specified postprocedural states: Secondary | ICD-10-CM | POA: Insufficient documentation

## 2017-12-25 DIAGNOSIS — Z9049 Acquired absence of other specified parts of digestive tract: Secondary | ICD-10-CM | POA: Insufficient documentation

## 2017-12-25 DIAGNOSIS — Z8673 Personal history of transient ischemic attack (TIA), and cerebral infarction without residual deficits: Secondary | ICD-10-CM | POA: Diagnosis not present

## 2017-12-25 DIAGNOSIS — I447 Left bundle-branch block, unspecified: Secondary | ICD-10-CM | POA: Diagnosis not present

## 2017-12-25 DIAGNOSIS — E785 Hyperlipidemia, unspecified: Secondary | ICD-10-CM | POA: Diagnosis not present

## 2017-12-25 DIAGNOSIS — Z8249 Family history of ischemic heart disease and other diseases of the circulatory system: Secondary | ICD-10-CM | POA: Insufficient documentation

## 2017-12-25 DIAGNOSIS — R0989 Other specified symptoms and signs involving the circulatory and respiratory systems: Secondary | ICD-10-CM | POA: Insufficient documentation

## 2017-12-25 DIAGNOSIS — R531 Weakness: Secondary | ICD-10-CM

## 2017-12-25 DIAGNOSIS — I739 Peripheral vascular disease, unspecified: Secondary | ICD-10-CM

## 2017-12-25 HISTORY — PX: LOWER EXTREMITY ANGIOGRAPHY: CATH118251

## 2017-12-25 HISTORY — DX: Peripheral vascular disease, unspecified: I73.9

## 2017-12-25 HISTORY — DX: Pure hypercholesterolemia, unspecified: E78.00

## 2017-12-25 HISTORY — PX: PERIPHERAL VASCULAR INTERVENTION: CATH118257

## 2017-12-25 HISTORY — DX: Essential (primary) hypertension: I10

## 2017-12-25 HISTORY — DX: Unspecified osteoarthritis, unspecified site: M19.90

## 2017-12-25 HISTORY — DX: Cerebral infarction, unspecified: I63.9

## 2017-12-25 HISTORY — DX: Type 2 diabetes mellitus without complications: E11.9

## 2017-12-25 HISTORY — DX: Personal history of other medical treatment: Z92.89

## 2017-12-25 HISTORY — PX: PERIPHERAL VASCULAR ATHERECTOMY: CATH118256

## 2017-12-25 HISTORY — PX: ABDOMINAL AORTOGRAM: CATH118222

## 2017-12-25 LAB — POTASSIUM: POTASSIUM: 4 mmol/L (ref 3.5–5.1)

## 2017-12-25 LAB — GLUCOSE, CAPILLARY
GLUCOSE-CAPILLARY: 256 mg/dL — AB (ref 65–99)
Glucose-Capillary: 226 mg/dL — ABNORMAL HIGH (ref 65–99)
Glucose-Capillary: 250 mg/dL — ABNORMAL HIGH (ref 65–99)

## 2017-12-25 LAB — POCT ACTIVATED CLOTTING TIME
ACTIVATED CLOTTING TIME: 230 s
Activated Clotting Time: 279 seconds

## 2017-12-25 SURGERY — LOWER EXTREMITY ANGIOGRAPHY
Anesthesia: LOCAL

## 2017-12-25 MED ORDER — CLOPIDOGREL BISULFATE 75 MG PO TABS
75.0000 mg | ORAL_TABLET | Freq: Every day | ORAL | Status: DC
  Administered 2017-12-25: 22:00:00 75 mg via ORAL
  Filled 2017-12-25 (×2): qty 1

## 2017-12-25 MED ORDER — AMLODIPINE BESYLATE 10 MG PO TABS: 10.0000 mg | ORAL_TABLET | Freq: Every evening | ORAL | Status: DC

## 2017-12-25 MED ORDER — LABETALOL HCL 5 MG/ML IV SOLN
10.0000 mg | INTRAVENOUS | Status: DC | PRN
  Administered 2017-12-25: 10 mg via INTRAVENOUS

## 2017-12-25 MED ORDER — ONDANSETRON HCL 4 MG/2ML IJ SOLN
4.0000 mg | Freq: Four times a day (QID) | INTRAMUSCULAR | Status: DC | PRN
  Administered 2017-12-25: 4 mg via INTRAVENOUS

## 2017-12-25 MED ORDER — HYDRALAZINE HCL 20 MG/ML IJ SOLN: 10.0000 mg | INTRAMUSCULAR | Status: DC | PRN

## 2017-12-25 MED ORDER — FOLIC ACID 1 MG PO TABS: 1.0000 mg | ORAL_TABLET | Freq: Every evening | ORAL | Status: DC

## 2017-12-25 MED ORDER — FENTANYL CITRATE (PF) 100 MCG/2ML IJ SOLN
50.0000 ug | INTRAMUSCULAR | Status: DC | PRN
  Administered 2017-12-25: 50 ug via INTRAVENOUS

## 2017-12-25 MED ORDER — SODIUM CHLORIDE 0.9% FLUSH: 3.0000 mL | Freq: Two times a day (BID) | INTRAVENOUS | Status: DC

## 2017-12-25 MED ORDER — ANGIOPLASTY BOOK
Freq: Once | Status: AC
  Administered 2017-12-25: 22:00:00
  Filled 2017-12-25: qty 1

## 2017-12-25 MED ORDER — SODIUM CHLORIDE 0.9 % IV SOLN: INTRAVENOUS | Status: DC

## 2017-12-25 MED ORDER — VIPERSLIDE LUBRICANT OPTIME
TOPICAL | Status: DC | PRN
  Administered 2017-12-25: 08:00:00 via SURGICAL_CAVITY

## 2017-12-25 MED ORDER — CALCIUM CARBONATE-VITAMIN D 600-400 MG-UNIT PO TABS: 1.0000 | ORAL_TABLET | Freq: Two times a day (BID) | ORAL | Status: DC

## 2017-12-25 MED ORDER — NEBIVOLOL HCL 2.5 MG PO TABS
2.5000 mg | ORAL_TABLET | Freq: Every day | ORAL | Status: DC
  Filled 2017-12-25: qty 1

## 2017-12-25 MED ORDER — SODIUM CHLORIDE 0.9% FLUSH: 3.0000 mL | INTRAVENOUS | Status: DC | PRN

## 2017-12-25 MED ORDER — CANAGLIFLOZIN 100 MG PO TABS
100.0000 mg | ORAL_TABLET | Freq: Every day | ORAL | Status: DC
  Administered 2017-12-26: 100 mg via ORAL
  Filled 2017-12-25: qty 1

## 2017-12-25 MED ORDER — HEPARIN (PORCINE) IN NACL 2-0.9 UNITS/ML
INTRAMUSCULAR | Status: AC | PRN
  Administered 2017-12-25 (×2): 500 mL

## 2017-12-25 MED ORDER — INSULIN ASPART 100 UNIT/ML ~~LOC~~ SOLN
3.0000 [IU] | Freq: Once | SUBCUTANEOUS | Status: AC
  Administered 2017-12-25: 3 [IU] via SUBCUTANEOUS

## 2017-12-25 MED ORDER — HEPARIN (PORCINE) IN NACL 1000-0.9 UT/500ML-% IV SOLN
INTRAVENOUS | Status: AC
  Filled 2017-12-25: qty 1000

## 2017-12-25 MED ORDER — LABETALOL HCL 5 MG/ML IV SOLN
INTRAVENOUS | Status: AC
  Administered 2017-12-25: 10 mg via INTRAVENOUS
  Filled 2017-12-25: qty 4

## 2017-12-25 MED ORDER — HYDRALAZINE HCL 20 MG/ML IJ SOLN
INTRAMUSCULAR | Status: DC | PRN
  Administered 2017-12-25: 10 mg via INTRAVENOUS

## 2017-12-25 MED ORDER — FENTANYL CITRATE (PF) 100 MCG/2ML IJ SOLN
INTRAMUSCULAR | Status: AC
  Administered 2017-12-25: 50 ug via INTRAVENOUS
  Filled 2017-12-25: qty 2

## 2017-12-25 MED ORDER — CALCIUM CARBONATE-VITAMIN D 500-200 MG-UNIT PO TABS
1.0000 | ORAL_TABLET | Freq: Two times a day (BID) | ORAL | Status: DC
  Administered 2017-12-25: 22:00:00 1 via ORAL
  Filled 2017-12-25 (×2): qty 1

## 2017-12-25 MED ORDER — FENTANYL CITRATE (PF) 100 MCG/2ML IJ SOLN
INTRAMUSCULAR | Status: AC
Start: 2017-12-25 — End: 2017-12-25
  Administered 2017-12-25: 25 ug
  Filled 2017-12-25: qty 2

## 2017-12-25 MED ORDER — MIDAZOLAM HCL 2 MG/2ML IJ SOLN
INTRAMUSCULAR | Status: AC
  Filled 2017-12-25: qty 2

## 2017-12-25 MED ORDER — PREGABALIN 25 MG PO CAPS
75.0000 mg | ORAL_CAPSULE | Freq: Two times a day (BID) | ORAL | Status: DC
  Administered 2017-12-25: 75 mg via ORAL
  Filled 2017-12-25 (×2): qty 3

## 2017-12-25 MED ORDER — NITROGLYCERIN IN D5W 200-5 MCG/ML-% IV SOLN
INTRAVENOUS | Status: AC
  Filled 2017-12-25: qty 250

## 2017-12-25 MED ORDER — ASPIRIN EC 81 MG PO TBEC
81.0000 mg | DELAYED_RELEASE_TABLET | Freq: Every day | ORAL | Status: DC
  Filled 2017-12-25: qty 1

## 2017-12-25 MED ORDER — FENTANYL CITRATE (PF) 100 MCG/2ML IJ SOLN: 25.0000 ug | Freq: Once | INTRAMUSCULAR | Status: DC

## 2017-12-25 MED ORDER — ATORVASTATIN CALCIUM 40 MG PO TABS: 40.0000 mg | ORAL_TABLET | Freq: Every day | ORAL | Status: DC

## 2017-12-25 MED ORDER — VERAPAMIL HCL 2.5 MG/ML IV SOLN
INTRAVENOUS | Status: AC
  Filled 2017-12-25: qty 2

## 2017-12-25 MED ORDER — OXYCODONE-ACETAMINOPHEN 5-325 MG PO TABS: 1.0000 | ORAL_TABLET | ORAL | 0 refills | Status: AC | PRN

## 2017-12-25 MED ORDER — HEPARIN SODIUM (PORCINE) 1000 UNIT/ML IJ SOLN
INTRAMUSCULAR | Status: AC
  Filled 2017-12-25: qty 1

## 2017-12-25 MED ORDER — FERROUS SULFATE 325 (65 FE) MG PO TABS
325.0000 mg | ORAL_TABLET | Freq: Two times a day (BID) | ORAL | Status: DC
  Filled 2017-12-25: qty 1

## 2017-12-25 MED ORDER — ONDANSETRON HCL 4 MG/2ML IJ SOLN
INTRAMUSCULAR | Status: AC
  Filled 2017-12-25: qty 2

## 2017-12-25 MED ORDER — SODIUM CHLORIDE 0.9 % WEIGHT BASED INFUSION: 1.0000 mL/kg/h | INTRAVENOUS | Status: AC

## 2017-12-25 MED ORDER — HEPARIN SODIUM (PORCINE) 1000 UNIT/ML IJ SOLN
INTRAMUSCULAR | Status: DC | PRN
  Administered 2017-12-25: 3000 [IU] via INTRAVENOUS
  Administered 2017-12-25: 5000 [IU] via INTRAVENOUS

## 2017-12-25 MED ORDER — HYDRALAZINE HCL 20 MG/ML IJ SOLN
INTRAMUSCULAR | Status: AC
  Filled 2017-12-25: qty 1

## 2017-12-25 MED ORDER — FENTANYL CITRATE (PF) 100 MCG/2ML IJ SOLN
INTRAMUSCULAR | Status: AC
  Filled 2017-12-25: qty 2

## 2017-12-25 MED ORDER — SODIUM CHLORIDE 0.9 % IV SOLN: 250.0000 mL | INTRAVENOUS | Status: DC | PRN

## 2017-12-25 MED ORDER — ACETAMINOPHEN 325 MG PO TABS
ORAL_TABLET | ORAL | Status: AC
  Filled 2017-12-25: qty 2

## 2017-12-25 MED ORDER — IODIXANOL 320 MG/ML IV SOLN
INTRAVENOUS | Status: DC | PRN
  Administered 2017-12-25: 175 mL via INTRAVENOUS

## 2017-12-25 MED ORDER — SODIUM CHLORIDE 0.9% FLUSH
3.0000 mL | Freq: Two times a day (BID) | INTRAVENOUS | Status: DC
  Administered 2017-12-25: 3 mL via INTRAVENOUS

## 2017-12-25 MED ORDER — FENTANYL CITRATE (PF) 100 MCG/2ML IJ SOLN
INTRAMUSCULAR | Status: DC | PRN
  Administered 2017-12-25 (×5): 25 ug via INTRAVENOUS

## 2017-12-25 MED ORDER — LIDOCAINE HCL (PF) 1 % IJ SOLN
INTRAMUSCULAR | Status: AC
  Filled 2017-12-25: qty 30

## 2017-12-25 MED ORDER — ENSURE ENLIVE PO LIQD
237.0000 mL | Freq: Two times a day (BID) | ORAL | Status: DC
  Filled 2017-12-25 (×5): qty 237

## 2017-12-25 MED ORDER — CARBAMAZEPINE 200 MG PO TABS
200.0000 mg | ORAL_TABLET | Freq: Two times a day (BID) | ORAL | Status: DC
  Administered 2017-12-25: 22:00:00 200 mg via ORAL
  Filled 2017-12-25 (×2): qty 1

## 2017-12-25 MED ORDER — LIDOCAINE HCL (PF) 1 % IJ SOLN
INTRAMUSCULAR | Status: DC | PRN
  Administered 2017-12-25: 12 mL
  Administered 2017-12-25: 18 mL

## 2017-12-25 MED ORDER — CEFAZOLIN SODIUM-DEXTROSE 2-4 GM/100ML-% IV SOLN
INTRAVENOUS | Status: AC
  Administered 2017-12-25: 2000 mg
  Filled 2017-12-25: qty 100

## 2017-12-25 MED ORDER — LOSARTAN POTASSIUM 50 MG PO TABS
50.0000 mg | ORAL_TABLET | Freq: Every day | ORAL | Status: DC
  Filled 2017-12-25: qty 1

## 2017-12-25 MED ORDER — SODIUM CHLORIDE 0.9 % IV BOLUS
500.0000 mL | Freq: Once | INTRAVENOUS | Status: AC
  Administered 2017-12-25: 07:00:00 via INTRAVENOUS

## 2017-12-25 MED ORDER — OXYCODONE-ACETAMINOPHEN 5-325 MG PO TABS: 1.0000 | ORAL_TABLET | ORAL | 0 refills | Status: DC | PRN

## 2017-12-25 MED ORDER — ACETAMINOPHEN 325 MG PO TABS
650.0000 mg | ORAL_TABLET | ORAL | Status: DC | PRN
  Administered 2017-12-25 – 2017-12-26 (×2): 650 mg via ORAL
  Filled 2017-12-25: qty 2

## 2017-12-25 MED ORDER — NITROGLYCERIN 1 MG/10 ML FOR IR/CATH LAB
INTRA_ARTERIAL | Status: AC
  Filled 2017-12-25: qty 10

## 2017-12-25 MED ORDER — MIDAZOLAM HCL 2 MG/2ML IJ SOLN
INTRAMUSCULAR | Status: DC | PRN
  Administered 2017-12-25 (×2): 1 mg via INTRAVENOUS

## 2017-12-25 SURGICAL SUPPLY — 28 items
BALLN MUSTANG 5.0X20 75 (BALLOONS) ×4
BALLOON MUSTANG 5.0X20 75 (BALLOONS) ×3 IMPLANT
CATH OMNI FLUSH 5F 65CM (CATHETERS) ×4 IMPLANT
CATH STRAIGHT 5FR 65CM (CATHETERS) ×4 IMPLANT
COVER PRB 48X5XTLSCP FOLD TPE (BAG) ×3 IMPLANT
COVER PROBE 5X48 (BAG) ×1
DEVICE CLOSURE MYNXGRIP 5F (Vascular Products) ×4 IMPLANT
DEVICE CLOSURE MYNXGRIP 6/7F (Vascular Products) ×4 IMPLANT
DIAMONDBACK SOLID OAS 2.0MM (CATHETERS) ×4
GUIDEWIRE ANGLED .035X150CM (WIRE) ×4 IMPLANT
KIT ENCORE 26 ADVANTAGE (KITS) ×4 IMPLANT
KIT MICROPUNCTURE NIT STIFF (SHEATH) ×4 IMPLANT
KIT PV (KITS) ×4 IMPLANT
LUBRICANT VIPERSLIDE CORONARY (MISCELLANEOUS) ×4 IMPLANT
SHEATH AVANTI 11CM 5FR (SHEATH) ×4 IMPLANT
SHEATH BRITE TIP 7FR 35CM (SHEATH) ×4 IMPLANT
SHEATH PINNACLE 7F 10CM (SHEATH) ×4 IMPLANT
STENT VIABAHN 8X39X80 VBX (Permanent Stent) ×4 IMPLANT
STOPCOCK MORSE 400PSI 3WAY (MISCELLANEOUS) ×4 IMPLANT
SYRINGE MEDRAD AVANTA MACH 7 (SYRINGE) ×4 IMPLANT
SYSTEM DIMNDBCK SLD OAS 2.0MM (CATHETERS) ×3 IMPLANT
TAPE VIPERTRACK RADIOPAQ (MISCELLANEOUS) ×3 IMPLANT
TAPE VIPERTRACK RADIOPAQUE (MISCELLANEOUS) ×1
TRANSDUCER W/STOPCOCK (MISCELLANEOUS) ×4 IMPLANT
TRAY PV CATH (CUSTOM PROCEDURE TRAY) ×4 IMPLANT
TUBING CIL FLEX 10 FLL-RA (TUBING) ×4 IMPLANT
WIRE HITORQ VERSACORE ST 145CM (WIRE) ×4 IMPLANT
WIRE VIPER ADVANCE .017X335CM (WIRE) ×4 IMPLANT

## 2017-12-25 NOTE — Interval H&P Note (Signed)
History and Physical Interval Note:  12/25/2017 7:37 AM  Brianna Hendricks  has presented today for surgery, with the diagnosis of claudication  The various methods of treatment have been discussed with the patient and family. After consideration of risks, benefits and other options for treatment, the patient has consented to  Procedure(s): LOWER EXTREMITY ANGIOGRAPHY (N/A) and possible angioplasty as a surgical intervention .  The patient's history has been reviewed, patient examined, no change in status, stable for surgery.  I have reviewed the patient's chart and labs.  Questions were answered to the patient's satisfaction.     Yates DecampJay Izabel Chim

## 2017-12-25 NOTE — Progress Notes (Signed)
Pt's family approached nurses desk stating that pt was nauseous. Pt stated it started after trying to eat. No emesis. Emesis bag and zofran given to pt as ordered.

## 2017-12-25 NOTE — Progress Notes (Signed)
Dr Ganji in 

## 2017-12-25 NOTE — Progress Notes (Signed)
Report called and transferred to 6-C-01 via stretcher

## 2017-12-25 NOTE — Progress Notes (Signed)
Client states feels better

## 2017-12-25 NOTE — Progress Notes (Signed)
Dr Jacinto HalimGanji notified that client has tea colored urine

## 2017-12-25 NOTE — Progress Notes (Signed)
Client states I would like to go home;however I do not feel like it; will notify Dr Jacinto HalimGanji

## 2017-12-25 NOTE — Progress Notes (Signed)
Hematoma left groin noted on arrival from cath lab and [facility] and groin softer after that; c/o 10/10 left leg pain; Dr Jacinto HalimGanji notified of c/o pain and hematoma and orders noted

## 2017-12-26 DIAGNOSIS — R0989 Other specified symptoms and signs involving the circulatory and respiratory systems: Secondary | ICD-10-CM | POA: Diagnosis not present

## 2017-12-26 DIAGNOSIS — E785 Hyperlipidemia, unspecified: Secondary | ICD-10-CM | POA: Diagnosis not present

## 2017-12-26 DIAGNOSIS — E1151 Type 2 diabetes mellitus with diabetic peripheral angiopathy without gangrene: Secondary | ICD-10-CM | POA: Diagnosis not present

## 2017-12-26 DIAGNOSIS — I10 Essential (primary) hypertension: Secondary | ICD-10-CM | POA: Diagnosis not present

## 2017-12-26 DIAGNOSIS — I70212 Atherosclerosis of native arteries of extremities with intermittent claudication, left leg: Secondary | ICD-10-CM | POA: Diagnosis not present

## 2017-12-26 DIAGNOSIS — I447 Left bundle-branch block, unspecified: Secondary | ICD-10-CM | POA: Diagnosis not present

## 2017-12-26 LAB — GLUCOSE, CAPILLARY: GLUCOSE-CAPILLARY: 167 mg/dL — AB (ref 65–99)

## 2017-12-26 MED ORDER — ATORVASTATIN CALCIUM 10 MG PO TABS: 10.0000 mg | ORAL_TABLET | Freq: Every evening | ORAL | Status: DC

## 2017-12-26 NOTE — Discharge Summary (Signed)
Physician Discharge Summary  Patient ID: Brianna BreslowChandrakanta Friedmann MRN: [id number] DOB/AGE: 1940/07/30 77 y.o.  Admit date: 12/25/2017 Discharge date: 12/26/2017  Primary Discharge Diagnosis Peripheral artery disease with claudication Diabetes mellitus with peripheral arterial disease as a complication Secondary Discharge Diagnosis Hypertension Hyperlipidemia  Significant Diagnostic Studies:  Peripheral arteriogram 12/25/2017: Abdominal aorta shows mild calcification.  2 renal arteries on right, one renal artery on left, widely patent.  Left common iliac artery has ostial and proximal tandem 80 to 90% calcific stenosis.  There is mild calcification of the iliac artery on the left, there is no significant disease in the left SFA.  Below the left knee the AT appears to be occluded at the mid segment.  Slow flow was evident, study was not completed to avoid contrast and radiation exposure. Right iliac artery proximal segment had a 20% calcific stenosis.  No pressure gradient across the stenosis.  Otherwise mild calcification evident in the iliac artery.  Femoral arterial system and right below knee there is three-vessel runoff without any significant disease. Interventional data:  Atherectomy left common iliac artery ostium and proximal segment with 2.0 mm solid crown CSI diamondback followed by implantation of 8.0 x 39 mm VBX Viabahn balloon expandable stent.  Calcific 90% tandem stenosis reduced to 0%.  Hospital Course: Patient admitted electively for peripheral arteriogram.  Postprocedure patient had pain in her access site and also had nausea and hence had to be kept overnight for observation.  She felt well the next morning, walked without any discomfort, back pain is chronic and unrelated to vascular complication.  Groin site without any hematoma.  Recommendations on discharge: She will be discharged home today with outpatient follow-up, ABI has been scheduled.  Discharge Exam: Blood pressure (!)  113/47, pulse 72, temperature 98.4 F (36.9 C), resp. rate 20, height 4\' 10"  (1.473 m), weight 48 kg (105 lb 13.1 oz), SpO2 96 %.  General appearance: alert, cooperative, appears stated age and no distress Resp: clear to auscultation bilaterally Cardio: regular rate and rhythm, S1, S2 normal, no murmur, click, rub or gallop GI: soft, non-tender; bowel sounds normal; no masses,  no organomegaly Extremities: extremities normal, atraumatic, no cyanosis or edema Pulses: Faint left carotid bruit present, bilateral femoral sites without any bruit, no hematoma, mild tenderness present.  Popliteal pulse 1-2+ bilaterally.  PT 2+ bilaterally, dorsalis pedis faint bilaterally. Neurologic: Grossly normal Labs:   Lab Results  Component Value Date   WBC 6.9 02/26/2007   HGB 11.1 (L) 02/26/2007   HCT 32.6 (L) 02/26/2007   MCV 79.2 (L) 02/26/2007   PLT 236 02/26/2007    Recent Labs  Lab 12/25/17 0658  K 4.0     FOLLOW UP PLANS AND APPOINTMENTS  Allergies as of 12/26/2017   No Known Allergies     Medication List    TAKE these medications   amLODipine 10 MG tablet Commonly known as:  NORVASC Take 10 mg by mouth every evening. What changed:  Another medication with the same name was removed. Continue taking this medication, and follow the directions you see here.   aspirin EC 81 MG tablet Take 81 mg by mouth daily.   atorvastatin 10 MG tablet Commonly known as:  LIPITOR Take 1 tablet (10 mg total) by mouth every evening. What changed:    medication strength  how much to take   CALTRATE 600+D 600-400 MG-UNIT tablet Generic drug:  Calcium Carbonate-Vitamin D Take 1 tablet by mouth 2 (two) times daily.   carbamazepine 200 MG  tablet Commonly known as:  TEGRETOL Take 200 mg by mouth 2 (two) times daily.   cholecalciferol 1000 units tablet Commonly known as:  VITAMIN D Take 1,000 Units by mouth 2 (two) times daily.   clopidogrel 75 MG tablet Commonly known as:  PLAVIX Take 75  mg by mouth every evening.   ferrous sulfate 325 (65 FE) MG tablet Take 325 mg by mouth 2 (two) times daily with a meal.   folic acid 1 MG tablet Commonly known as:  FOLVITE Take 1 mg by mouth every evening.   JARDIANCE 10 MG Tabs tablet Generic drug:  empagliflozin Take 5 mg by mouth 2 (two) times daily.   losartan 50 MG tablet Commonly known as:  COZAAR Take 50 mg by mouth daily.   metFORMIN 500 MG tablet Commonly known as:  GLUCOPHAGE Take 500 mg by mouth 2 (two) times daily with a meal.   nebivolol 2.5 MG tablet Commonly known as:  BYSTOLIC Take 2.5 mg by mouth daily.   oxyCODONE-acetaminophen 5-325 MG tablet Commonly known as:  PERCOCET Take 1 tablet by mouth every 4 (four) hours as needed for severe pain.   pregabalin 75 MG capsule Commonly known as:  LYRICA Take 75 mg by mouth 2 (two) times daily.      Follow-up Information    Yates Decamp, MD.   Specialty:  Cardiology Why:  Keep previous appointment Contact information: [address] [place] [address] [phone]           Yates Decamp, MD 12/26/2017, 8:30 AM  Pager: [phone] Office: [phone] If no answer: [phone]

## 2017-12-26 NOTE — Discharge Instructions (Signed)
NO METFORMIN/GLUCOPHAGE  FOR 2 DAYS 

## 2017-12-27 MED FILL — Heparin Sod (Porcine)-NaCl IV Soln 1000 Unit/500ML-0.9%: INTRAVENOUS | Qty: 1000 | Status: AC

## 2018-01-02 DIAGNOSIS — I739 Peripheral vascular disease, unspecified: Secondary | ICD-10-CM | POA: Diagnosis not present

## 2018-01-02 DIAGNOSIS — E1151 Type 2 diabetes mellitus with diabetic peripheral angiopathy without gangrene: Secondary | ICD-10-CM | POA: Diagnosis not present

## 2018-01-02 DIAGNOSIS — R0989 Other specified symptoms and signs involving the circulatory and respiratory systems: Secondary | ICD-10-CM | POA: Diagnosis not present

## 2018-01-02 DIAGNOSIS — Z0189 Encounter for other specified special examinations: Secondary | ICD-10-CM | POA: Diagnosis not present

## 2018-01-23 DIAGNOSIS — I739 Peripheral vascular disease, unspecified: Secondary | ICD-10-CM | POA: Diagnosis not present

## 2018-02-01 DIAGNOSIS — Z1339 Encounter for screening examination for other mental health and behavioral disorders: Secondary | ICD-10-CM | POA: Diagnosis not present

## 2018-02-01 DIAGNOSIS — E1165 Type 2 diabetes mellitus with hyperglycemia: Secondary | ICD-10-CM | POA: Diagnosis not present

## 2018-02-01 DIAGNOSIS — Z1331 Encounter for screening for depression: Secondary | ICD-10-CM | POA: Diagnosis not present

## 2018-02-01 DIAGNOSIS — E7800 Pure hypercholesterolemia, unspecified: Secondary | ICD-10-CM | POA: Diagnosis not present

## 2018-02-01 DIAGNOSIS — R5383 Other fatigue: Secondary | ICD-10-CM | POA: Diagnosis not present

## 2018-02-01 DIAGNOSIS — Z79899 Other long term (current) drug therapy: Secondary | ICD-10-CM | POA: Diagnosis not present

## 2018-02-01 DIAGNOSIS — Z6820 Body mass index (BMI) 20.0-20.9, adult: Secondary | ICD-10-CM | POA: Diagnosis not present

## 2018-02-01 DIAGNOSIS — Z1211 Encounter for screening for malignant neoplasm of colon: Secondary | ICD-10-CM | POA: Diagnosis not present

## 2018-02-01 DIAGNOSIS — Z7189 Other specified counseling: Secondary | ICD-10-CM | POA: Diagnosis not present

## 2018-02-01 DIAGNOSIS — Z0000 Encounter for general adult medical examination without abnormal findings: Secondary | ICD-10-CM | POA: Diagnosis not present

## 2018-02-01 DIAGNOSIS — I10 Essential (primary) hypertension: Secondary | ICD-10-CM | POA: Diagnosis not present

## 2018-02-01 DIAGNOSIS — Z299 Encounter for prophylactic measures, unspecified: Secondary | ICD-10-CM | POA: Diagnosis not present

## 2018-03-13 DIAGNOSIS — Z299 Encounter for prophylactic measures, unspecified: Secondary | ICD-10-CM | POA: Diagnosis not present

## 2018-03-13 DIAGNOSIS — E1165 Type 2 diabetes mellitus with hyperglycemia: Secondary | ICD-10-CM | POA: Diagnosis not present

## 2018-03-13 DIAGNOSIS — I10 Essential (primary) hypertension: Secondary | ICD-10-CM | POA: Diagnosis not present

## 2018-03-13 DIAGNOSIS — Z681 Body mass index (BMI) 19 or less, adult: Secondary | ICD-10-CM | POA: Diagnosis not present

## 2018-03-13 DIAGNOSIS — Z713 Dietary counseling and surveillance: Secondary | ICD-10-CM | POA: Diagnosis not present

## 2018-05-07 DIAGNOSIS — Z0189 Encounter for other specified special examinations: Secondary | ICD-10-CM | POA: Diagnosis not present

## 2018-05-07 DIAGNOSIS — I739 Peripheral vascular disease, unspecified: Secondary | ICD-10-CM | POA: Diagnosis not present

## 2018-05-07 DIAGNOSIS — E1151 Type 2 diabetes mellitus with diabetic peripheral angiopathy without gangrene: Secondary | ICD-10-CM | POA: Diagnosis not present

## 2018-05-07 DIAGNOSIS — R0989 Other specified symptoms and signs involving the circulatory and respiratory systems: Secondary | ICD-10-CM | POA: Diagnosis not present

## 2018-05-10 DIAGNOSIS — I10 Essential (primary) hypertension: Secondary | ICD-10-CM | POA: Diagnosis not present

## 2018-05-10 DIAGNOSIS — Z299 Encounter for prophylactic measures, unspecified: Secondary | ICD-10-CM | POA: Diagnosis not present

## 2018-05-10 DIAGNOSIS — Z713 Dietary counseling and surveillance: Secondary | ICD-10-CM | POA: Diagnosis not present

## 2018-05-10 DIAGNOSIS — Z6820 Body mass index (BMI) 20.0-20.9, adult: Secondary | ICD-10-CM | POA: Diagnosis not present

## 2018-05-10 DIAGNOSIS — E1165 Type 2 diabetes mellitus with hyperglycemia: Secondary | ICD-10-CM | POA: Diagnosis not present

## 2018-06-14 DIAGNOSIS — E7800 Pure hypercholesterolemia, unspecified: Secondary | ICD-10-CM | POA: Diagnosis not present

## 2018-06-14 DIAGNOSIS — Z299 Encounter for prophylactic measures, unspecified: Secondary | ICD-10-CM | POA: Diagnosis not present

## 2018-06-14 DIAGNOSIS — E1165 Type 2 diabetes mellitus with hyperglycemia: Secondary | ICD-10-CM | POA: Diagnosis not present

## 2018-06-14 DIAGNOSIS — M5412 Radiculopathy, cervical region: Secondary | ICD-10-CM | POA: Diagnosis not present

## 2018-06-14 DIAGNOSIS — I10 Essential (primary) hypertension: Secondary | ICD-10-CM | POA: Diagnosis not present

## 2018-06-14 DIAGNOSIS — Z6820 Body mass index (BMI) 20.0-20.9, adult: Secondary | ICD-10-CM | POA: Diagnosis not present

## 2018-06-24 DIAGNOSIS — R42 Dizziness and giddiness: Secondary | ICD-10-CM | POA: Diagnosis not present

## 2018-06-24 DIAGNOSIS — M50222 Other cervical disc displacement at C5-C6 level: Secondary | ICD-10-CM | POA: Diagnosis not present

## 2018-06-24 DIAGNOSIS — M542 Cervicalgia: Secondary | ICD-10-CM | POA: Diagnosis not present

## 2018-09-18 DIAGNOSIS — I10 Essential (primary) hypertension: Secondary | ICD-10-CM | POA: Diagnosis not present

## 2018-09-18 DIAGNOSIS — Z299 Encounter for prophylactic measures, unspecified: Secondary | ICD-10-CM | POA: Diagnosis not present

## 2018-09-18 DIAGNOSIS — E1165 Type 2 diabetes mellitus with hyperglycemia: Secondary | ICD-10-CM | POA: Diagnosis not present

## 2018-09-18 DIAGNOSIS — Z6820 Body mass index (BMI) 20.0-20.9, adult: Secondary | ICD-10-CM | POA: Diagnosis not present

## 2018-12-23 DIAGNOSIS — E1140 Type 2 diabetes mellitus with diabetic neuropathy, unspecified: Secondary | ICD-10-CM | POA: Diagnosis not present

## 2018-12-23 DIAGNOSIS — D649 Anemia, unspecified: Secondary | ICD-10-CM | POA: Diagnosis not present

## 2018-12-23 DIAGNOSIS — I10 Essential (primary) hypertension: Secondary | ICD-10-CM | POA: Diagnosis not present

## 2018-12-23 DIAGNOSIS — Z299 Encounter for prophylactic measures, unspecified: Secondary | ICD-10-CM | POA: Diagnosis not present

## 2018-12-23 DIAGNOSIS — Z6820 Body mass index (BMI) 20.0-20.9, adult: Secondary | ICD-10-CM | POA: Diagnosis not present

## 2018-12-23 DIAGNOSIS — R569 Unspecified convulsions: Secondary | ICD-10-CM | POA: Diagnosis not present

## 2019-02-03 DIAGNOSIS — E7800 Pure hypercholesterolemia, unspecified: Secondary | ICD-10-CM | POA: Diagnosis not present

## 2019-02-03 DIAGNOSIS — Z1331 Encounter for screening for depression: Secondary | ICD-10-CM | POA: Diagnosis not present

## 2019-02-03 DIAGNOSIS — Z6820 Body mass index (BMI) 20.0-20.9, adult: Secondary | ICD-10-CM | POA: Diagnosis not present

## 2019-02-03 DIAGNOSIS — R5383 Other fatigue: Secondary | ICD-10-CM | POA: Diagnosis not present

## 2019-02-03 DIAGNOSIS — E1165 Type 2 diabetes mellitus with hyperglycemia: Secondary | ICD-10-CM | POA: Diagnosis not present

## 2019-02-03 DIAGNOSIS — I10 Essential (primary) hypertension: Secondary | ICD-10-CM | POA: Diagnosis not present

## 2019-02-03 DIAGNOSIS — Z1211 Encounter for screening for malignant neoplasm of colon: Secondary | ICD-10-CM | POA: Diagnosis not present

## 2019-02-03 DIAGNOSIS — Z1339 Encounter for screening examination for other mental health and behavioral disorders: Secondary | ICD-10-CM | POA: Diagnosis not present

## 2019-02-03 DIAGNOSIS — Z299 Encounter for prophylactic measures, unspecified: Secondary | ICD-10-CM | POA: Diagnosis not present

## 2019-02-03 DIAGNOSIS — Z0000 Encounter for general adult medical examination without abnormal findings: Secondary | ICD-10-CM | POA: Diagnosis not present

## 2019-02-03 DIAGNOSIS — G629 Polyneuropathy, unspecified: Secondary | ICD-10-CM | POA: Diagnosis not present

## 2019-02-03 DIAGNOSIS — Z7189 Other specified counseling: Secondary | ICD-10-CM | POA: Diagnosis not present

## 2019-03-20 DIAGNOSIS — E1169 Type 2 diabetes mellitus with other specified complication: Secondary | ICD-10-CM | POA: Diagnosis not present

## 2019-03-24 DIAGNOSIS — E119 Type 2 diabetes mellitus without complications: Secondary | ICD-10-CM | POA: Diagnosis not present

## 2019-03-24 DIAGNOSIS — R197 Diarrhea, unspecified: Secondary | ICD-10-CM | POA: Diagnosis not present

## 2019-03-24 DIAGNOSIS — G629 Polyneuropathy, unspecified: Secondary | ICD-10-CM | POA: Diagnosis not present

## 2019-03-24 DIAGNOSIS — Z6820 Body mass index (BMI) 20.0-20.9, adult: Secondary | ICD-10-CM | POA: Diagnosis not present

## 2019-03-24 DIAGNOSIS — I10 Essential (primary) hypertension: Secondary | ICD-10-CM | POA: Diagnosis not present

## 2019-03-24 DIAGNOSIS — Z713 Dietary counseling and surveillance: Secondary | ICD-10-CM | POA: Diagnosis not present

## 2019-03-24 DIAGNOSIS — Z299 Encounter for prophylactic measures, unspecified: Secondary | ICD-10-CM | POA: Diagnosis not present

## 2019-04-01 DIAGNOSIS — I10 Essential (primary) hypertension: Secondary | ICD-10-CM | POA: Diagnosis not present

## 2019-04-01 DIAGNOSIS — N390 Urinary tract infection, site not specified: Secondary | ICD-10-CM | POA: Diagnosis not present

## 2019-04-01 DIAGNOSIS — D509 Iron deficiency anemia, unspecified: Secondary | ICD-10-CM | POA: Diagnosis not present

## 2019-04-01 DIAGNOSIS — R300 Dysuria: Secondary | ICD-10-CM | POA: Diagnosis not present

## 2019-04-01 DIAGNOSIS — E1165 Type 2 diabetes mellitus with hyperglycemia: Secondary | ICD-10-CM | POA: Diagnosis not present

## 2019-04-01 DIAGNOSIS — Z6820 Body mass index (BMI) 20.0-20.9, adult: Secondary | ICD-10-CM | POA: Diagnosis not present

## 2019-04-01 DIAGNOSIS — Z299 Encounter for prophylactic measures, unspecified: Secondary | ICD-10-CM | POA: Diagnosis not present

## 2019-04-18 DIAGNOSIS — E1169 Type 2 diabetes mellitus with other specified complication: Secondary | ICD-10-CM | POA: Diagnosis not present

## 2019-04-29 DIAGNOSIS — H18513 Endothelial corneal dystrophy, bilateral: Secondary | ICD-10-CM | POA: Diagnosis not present

## 2019-04-29 DIAGNOSIS — E113293 Type 2 diabetes mellitus with mild nonproliferative diabetic retinopathy without macular edema, bilateral: Secondary | ICD-10-CM | POA: Diagnosis not present

## 2019-04-29 DIAGNOSIS — H31092 Other chorioretinal scars, left eye: Secondary | ICD-10-CM | POA: Diagnosis not present

## 2019-04-29 DIAGNOSIS — H26493 Other secondary cataract, bilateral: Secondary | ICD-10-CM | POA: Diagnosis not present

## 2019-04-29 DIAGNOSIS — Z961 Presence of intraocular lens: Secondary | ICD-10-CM | POA: Diagnosis not present

## 2019-05-07 DIAGNOSIS — E1165 Type 2 diabetes mellitus with hyperglycemia: Secondary | ICD-10-CM | POA: Diagnosis not present

## 2019-05-07 DIAGNOSIS — Z6820 Body mass index (BMI) 20.0-20.9, adult: Secondary | ICD-10-CM | POA: Diagnosis not present

## 2019-05-07 DIAGNOSIS — E7800 Pure hypercholesterolemia, unspecified: Secondary | ICD-10-CM | POA: Diagnosis not present

## 2019-05-07 DIAGNOSIS — Z299 Encounter for prophylactic measures, unspecified: Secondary | ICD-10-CM | POA: Diagnosis not present

## 2019-05-07 DIAGNOSIS — I10 Essential (primary) hypertension: Secondary | ICD-10-CM | POA: Diagnosis not present

## 2019-05-09 ENCOUNTER — Encounter: Payer: Self-pay | Admitting: Cardiology

## 2019-05-09 ENCOUNTER — Ambulatory Visit (INDEPENDENT_AMBULATORY_CARE_PROVIDER_SITE_OTHER): Payer: Medicare Other | Admitting: Cardiology

## 2019-05-09 ENCOUNTER — Other Ambulatory Visit: Payer: Self-pay

## 2019-05-09 VITALS — BP 156/66 | HR 91 | Ht <= 58 in | Wt 112.8 lb

## 2019-05-09 DIAGNOSIS — E782 Mixed hyperlipidemia: Secondary | ICD-10-CM | POA: Diagnosis not present

## 2019-05-09 DIAGNOSIS — I739 Peripheral vascular disease, unspecified: Secondary | ICD-10-CM | POA: Diagnosis not present

## 2019-05-09 DIAGNOSIS — E118 Type 2 diabetes mellitus with unspecified complications: Secondary | ICD-10-CM

## 2019-05-09 DIAGNOSIS — I10 Essential (primary) hypertension: Secondary | ICD-10-CM

## 2019-05-09 NOTE — Progress Notes (Signed)
Primary Physician/Referring:  Glenda Chroman, MD  Patient ID: Brianna Hendricks, female    DOB: 03-15-1941, 78 y.o.   MRN: [id number]  Chief Complaint  Patient presents with  . PAD  . Follow-up   HPI:    HPI: Brianna Hendricks  is a 78 y.o. with history of chronic LBBB, hypertension, hyperlipidemia, diabetes mellitus and PAD underwent peripheral arteriogram on 11/19/2017 with revascularization of high grade stenosis of left iliac artery, presents for follow-up, has noticed marked improvement in claudication.   Presents today for 1 year follow up. She is presently doing well and has been walking daily in the neighbourhood, denies chest pain or dyspnea and states she has not had any recurrence of claudication. Remains active and is walking daily without chest pain or dyspnea.   Past Medical History:  Diagnosis Date  . Arthritis    "some in my" 4th digit right hand  (12/25/2017)  . High cholesterol   . History of blood transfusion    "related to female bleeding"  . Hypertension   . PAD (peripheral artery disease) ([place])   . Stroke [facility]) 1998   "fully recovered" (12/25/2017)  . Type II diabetes mellitus ([place])    Past Surgical History:  Procedure Laterality Date  . ABDOMINAL AORTOGRAM N/A 12/25/2017   Procedure: ABDOMINAL AORTOGRAM;  Surgeon: Adrian Prows, MD;  Location: [place] [facility];  Service: Cardiovascular;  Laterality: N/A;  . CATARACT EXTRACTION W/ INTRAOCULAR LENS  IMPLANT, BILATERAL Bilateral   . CHOLECYSTECTOMY    . LOWER EXTREMITY ANGIOGRAPHY Bilateral 12/25/2017   Procedure: LOWER EXTREMITY ANGIOGRAPHY;  Surgeon: Adrian Prows, MD;  Location: [place] [facility];  Service: Cardiovascular;  Laterality: Bilateral;  . PERIPHERAL VASCULAR ATHERECTOMY Left 12/25/2017   Procedure: PERIPHERAL VASCULAR ATHERECTOMY;  Surgeon: Adrian Prows, MD;  Location: [place] [facility];  Service: Cardiovascular;  Laterality: Left;  common iliac  . PERIPHERAL VASCULAR INTERVENTION  Left 12/25/2017   Procedure: PERIPHERAL VASCULAR INTERVENTION;  Surgeon: Adrian Prows, MD;  Location: [place] [facility];  Service: Cardiovascular;  Laterality: Left;  common iliac  . THYROID SURGERY  1972   " not sure what was done:"  . TONSILLECTOMY     Social History   Socioeconomic History  . Marital status: Married    Spouse name: Not on file  . Number of children: 2  . Years of education: Not on file  . Highest education level: Not on file  Occupational History  . Not on file  Social Needs  . Financial resource strain: Not on file  . Food insecurity    Worry: Not on file    Inability: Not on file  . Transportation needs    Medical: Not on file    Non-medical: Not on file  Tobacco Use  . Smoking status: Never Smoker  . Smokeless tobacco: Never Used  Substance and Sexual Activity  . Alcohol use: Never    Frequency: Never  . Drug use: Never  . Sexual activity: Not on file  Lifestyle  . Physical activity    Days per week: Not on file    Minutes per session: Not on file  . Stress: Not on file  Relationships  . Social Herbalist on phone: Not on file    Gets together: Not on file    Attends religious service: Not on file    Active member of club or organization: Not on file    Attends meetings of clubs or  organizations: Not on file    Relationship status: Not on file  . Intimate partner violence    Fear of current or ex partner: Not on file    Emotionally abused: Not on file    Physically abused: Not on file    Forced sexual activity: Not on file  Other Topics Concern  . Not on file  Social History Narrative  . Not on file   ROS  Review of Systems  Constitution: Negative for decreased appetite, malaise/fatigue, weight gain and weight loss.  Eyes: Negative for visual disturbance.  Cardiovascular: Negative for chest pain, claudication, dyspnea on exertion, leg swelling, orthopnea, palpitations and syncope.  Respiratory: Negative for hemoptysis and  wheezing.   Endocrine: Negative for cold intolerance and heat intolerance.  Hematologic/Lymphatic: Does not bruise/bleed easily.  Skin: Negative for nail changes.  Musculoskeletal: Positive for back pain. Negative for muscle weakness and myalgias.  Gastrointestinal: Negative for abdominal pain, change in bowel habit, nausea and vomiting.  Neurological: Negative for difficulty with concentration, dizziness, focal weakness and headaches.  Psychiatric/Behavioral: Negative for altered mental status and suicidal ideas.  All other systems reviewed and are negative.  Objective  Blood pressure (!) 156/66, pulse 91, height 4' 10"  (1.473 m), weight 112 lb 12.8 oz (51.2 kg), SpO2 97 %. Body mass index is 23.58 kg/m.   Physical Exam  Constitutional: She is oriented to person, place, and time. Vital signs are normal. She appears well-developed and well-nourished.  HENT:  Head: Normocephalic and atraumatic.  Neck: Normal range of motion.  Cardiovascular: Normal rate, regular rhythm, normal heart sounds, intact distal pulses and normal pulses.  Pulses:      Carotid pulses are 2+ on the right side with bruit and 2+ on the left side with bruit. No leg edema,  No leg edema  Pulmonary/Chest: Effort normal and breath sounds normal. No accessory muscle usage. No respiratory distress.  Abdominal: Soft. Bowel sounds are normal.  Musculoskeletal: Normal range of motion.  Neurological: She is alert and oriented to person, place, and time.  Skin: Skin is warm and dry.  Vitals reviewed.  Radiology: No results found.  Laboratory examination:   12/13/2017: Glucose 211, creatinine 0.86, EGFR 67/77, potassium 5.3, BMP otherwise normal. CBC normal.  12/20/2016: Cholesterol 178, triglycerides 315, HDL 55, LDL 60. TSH 2.9. Hemoglobin 9.2, hematocrit 29.2, microcytic indices, CBC otherwise normal. Creatinine 0.7, EGFR 86/99, potassium 5.4, CMP otherwise normal.  No results for input(s): NA, K, CL, CO2, GLUCOSE,  BUN, CREATININE, CALCIUM, GFRNONAA, GFRAA in the last 8760 hours. CMP Latest Ref Rng & Units 12/25/2017 02/26/2007  Glucose 70 - 99 mg/dL - 149(H)  BUN 6 - 23 mg/dL - 14  Creatinine 0.40 - 1.20 mg/dL - 0.81  Sodium 135 - 145 mEq/L - 135  Potassium 3.5 - 5.1 mmol/L 4.0 4.0  Chloride 96 - 112 mEq/L - 99  CO2 19 - 32 mEq/L - 23  Calcium 8.4 - 10.5 mg/dL - 9.3  Total Protein 6.0 - 8.3 g/dL - 7.5  Total Bilirubin 0.3 - 1.2 mg/dL - 0.3  Alkaline Phos 39 - 117 U/L - 96  AST 0 - 37 U/L - 19  ALT 0 - 35 U/L - 19   CBC Latest Ref Rng & Units 02/26/2007  WBC 3.9 - 10.0 10e3/uL 6.9  Hemoglobin 11.6 - 15.9 g/dL 11.1(L)  Hematocrit 34.8 - 46.6 % 32.6(L)  Platelets 145 - 400 10e3/uL 236   Lipid Panel  No results found for: CHOL, TRIG, HDL,  CHOLHDL, VLDL, LDLCALC, LDLDIRECT HEMOGLOBIN A1C No results found for: HGBA1C, MPG TSH No results for input(s): TSH in the last 8760 hours. Medications   Current Outpatient Medications  Medication Instructions  . amLODipine (NORVASC) 5 mg, Oral  . aspirin EC 81 mg, Oral, Daily  . atorvastatin (LIPITOR) 10 mg, Oral, Every evening  . Calcium Carbonate-Vitamin D (CALTRATE 600+D) 600-400 MG-UNIT tablet 1 tablet, Oral, 2 times daily  . carbamazepine (TEGRETOL) 200 mg, Oral, 2 times daily  . cholecalciferol (VITAMIN D) 1,000 Units, Oral, 2 times daily  . empagliflozin (JARDIANCE) 5 mg, Oral, 2 times daily  . ferrous sulfate 150 mg, Oral, Daily  . folic acid (FOLVITE) 1 mg, Oral, Every evening  . glipiZIDE (GLUCOTROL) 5 MG tablet Oral, 2 times daily  . losartan (COZAAR) 50 mg, Oral, Daily  . metFORMIN (GLUCOPHAGE) 500 mg, Oral, 2 times daily with meals  . pregabalin (LYRICA) 75 mg, Oral, Daily    Cardiac Studies:   Echo- 04/24/13 1.Left ventricular cavity is normal in size.  Abnormal septal motion due to IVCD.  Lower limits systolic global function.   Calculated EF 55%.   Visual EF is 50-55%.   Doppler evidence of Grade I (impaired) diastolic  dysfunction. 2.Left atrial cavity is slightly dilated. 3.Mild calcification of the mitral annulus.   Trace mitral regurgitation.   Mitral valve inflow A > E ratio. 4.Tricuspid valve structurally normal.   Mild tricuspid regurgitation.  Lexiscan Myoview stress test 04/25/2013: 1. Resting EKG NSR, LBBB. Stress EKG was non diagnostic for ischemia. No ST-T changes of ischemia noted with pharmacologic stress testing. Stress symptoms included chest pressure, sob, stomach discomfort and headache. Stress terminated due to completion of protocol. 2. The perfusion study demonstrated mild breast attenuation artifact in the anterior wall. There was no evidence of ischemia or scar. Dynamic gated images reveal normal wall motion and endocardial thickening. Left ventricular ejection fraction was estimated to be 69%.  This represents a low risk study.  Carotid artery duplex 12/21/2017: No significant stenosis right ICA. 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 Antegrade right vertebral artery flow. Antegrade left vertebral artery flow. Follow up in one year or if clinically indicated. No significant change from 05/01/2013.  Peripheral arteriogram 12/25/2017: Left common iliac artery tandem 99% calcific stenosis S/P 2.0 solid crown CSI orbital atherectomy and Viabahn. Left AT appears occluded distally. Otherwise mild disease bilaterally.  ABI 01/23/2018: Moderately abnormal waveforms of the right ankle. Mildly abnormal waveforms of the left ankle. Mildly decreased right resting ABI. Mildly decreased left resting ABI. Compared to ABI 11/19/2017, normal ABI right, left was 0.7.  Assessment     ICD-10-CM   1. PAD (peripheral artery disease) (HCC)  I73.9 EKG 12-Lead  2. Primary hypertension  I10   3. Mixed hyperlipidemia  E78.2   4. Type 2 diabetes mellitus with complication, without long-term current use of insulin (HCC)  E11.8     EKG 05/09/2019: Normal sinus rhythm at 74  bpm, left bundle branch block, no further analysis. No changes from EKG 11/29/2017.  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   Patient is here for 1 year office visit and follow-up for PAD.  She is presently doing well without any complaints.  Claudication symptoms have continued  to improve.  She continues to walk on a daily basis.  Blood pressure is noted to be elevated today. Patient also states her pressure has been high greater than 150 mmHg at home as well.  Patient has Bystolic 2.5 mg at home, she probably needs at least 5 if not 10 mg every day. The medications were from [place] and she will confirm it this is indeed Nebivolol  and if confirmed start at 5 mg daily and increase if needed after she f/u with her PCP Dr. Jerene Bears.  Past examination is completely normal, she has very prominent bilateral carotid artery bruit however carotid artery duplex had not revealed any significant disease.  Suspect external carotid artery stenosis to be the etiology for her bruit.  She will continue aggressive secondary prevention with LDL goal less than or equal to 70 and blood pressure goal less than equal to 130/80 mmHg.  She'll follow-up with her PCP and I'll see her back on a p.r.n. basis.  Adrian Prows, MD, [facility] 05/09/2019, 3:21 PM [facility] [place] Pager: [phone] Office: [phone] If no answer Cell [phone]

## 2019-05-14 ENCOUNTER — Ambulatory Visit: Payer: Self-pay | Admitting: Cardiology

## 2019-05-19 DIAGNOSIS — E1169 Type 2 diabetes mellitus with other specified complication: Secondary | ICD-10-CM | POA: Diagnosis not present

## 2019-07-16 DIAGNOSIS — E1169 Type 2 diabetes mellitus with other specified complication: Secondary | ICD-10-CM | POA: Diagnosis not present

## 2019-08-15 DIAGNOSIS — I10 Essential (primary) hypertension: Secondary | ICD-10-CM | POA: Diagnosis not present

## 2019-08-15 DIAGNOSIS — H05041 Tenonitis of right orbit: Secondary | ICD-10-CM | POA: Diagnosis not present

## 2019-08-15 DIAGNOSIS — I779 Disorder of arteries and arterioles, unspecified: Secondary | ICD-10-CM | POA: Diagnosis not present

## 2019-08-15 DIAGNOSIS — Z6820 Body mass index (BMI) 20.0-20.9, adult: Secondary | ICD-10-CM | POA: Diagnosis not present

## 2019-08-15 DIAGNOSIS — E1165 Type 2 diabetes mellitus with hyperglycemia: Secondary | ICD-10-CM | POA: Diagnosis not present

## 2019-08-15 DIAGNOSIS — Z299 Encounter for prophylactic measures, unspecified: Secondary | ICD-10-CM | POA: Diagnosis not present

## 2019-08-20 DIAGNOSIS — E1169 Type 2 diabetes mellitus with other specified complication: Secondary | ICD-10-CM | POA: Diagnosis not present

## 2019-09-21 DIAGNOSIS — E1169 Type 2 diabetes mellitus with other specified complication: Secondary | ICD-10-CM | POA: Diagnosis not present

## 2019-10-21 DIAGNOSIS — E1169 Type 2 diabetes mellitus with other specified complication: Secondary | ICD-10-CM | POA: Diagnosis not present

## 2019-11-21 DIAGNOSIS — E1169 Type 2 diabetes mellitus with other specified complication: Secondary | ICD-10-CM | POA: Diagnosis not present

## 2019-11-26 DIAGNOSIS — I779 Disorder of arteries and arterioles, unspecified: Secondary | ICD-10-CM | POA: Diagnosis not present

## 2019-11-26 DIAGNOSIS — E1165 Type 2 diabetes mellitus with hyperglycemia: Secondary | ICD-10-CM | POA: Diagnosis not present

## 2019-11-26 DIAGNOSIS — I10 Essential (primary) hypertension: Secondary | ICD-10-CM | POA: Diagnosis not present

## 2019-11-26 DIAGNOSIS — E1140 Type 2 diabetes mellitus with diabetic neuropathy, unspecified: Secondary | ICD-10-CM | POA: Diagnosis not present

## 2019-11-26 DIAGNOSIS — Z299 Encounter for prophylactic measures, unspecified: Secondary | ICD-10-CM | POA: Diagnosis not present

## 2019-12-21 DIAGNOSIS — E1169 Type 2 diabetes mellitus with other specified complication: Secondary | ICD-10-CM | POA: Diagnosis not present

## 2020-01-21 DIAGNOSIS — E1169 Type 2 diabetes mellitus with other specified complication: Secondary | ICD-10-CM | POA: Diagnosis not present

## 2020-02-16 DIAGNOSIS — Z1339 Encounter for screening examination for other mental health and behavioral disorders: Secondary | ICD-10-CM | POA: Diagnosis not present

## 2020-02-16 DIAGNOSIS — Z681 Body mass index (BMI) 19 or less, adult: Secondary | ICD-10-CM | POA: Diagnosis not present

## 2020-02-16 DIAGNOSIS — E1165 Type 2 diabetes mellitus with hyperglycemia: Secondary | ICD-10-CM | POA: Diagnosis not present

## 2020-02-16 DIAGNOSIS — Z1331 Encounter for screening for depression: Secondary | ICD-10-CM | POA: Diagnosis not present

## 2020-02-16 DIAGNOSIS — R5383 Other fatigue: Secondary | ICD-10-CM | POA: Diagnosis not present

## 2020-02-16 DIAGNOSIS — E7800 Pure hypercholesterolemia, unspecified: Secondary | ICD-10-CM | POA: Diagnosis not present

## 2020-02-16 DIAGNOSIS — Z0000 Encounter for general adult medical examination without abnormal findings: Secondary | ICD-10-CM | POA: Diagnosis not present

## 2020-02-16 DIAGNOSIS — Z299 Encounter for prophylactic measures, unspecified: Secondary | ICD-10-CM | POA: Diagnosis not present

## 2020-02-16 DIAGNOSIS — I10 Essential (primary) hypertension: Secondary | ICD-10-CM | POA: Diagnosis not present

## 2020-02-16 DIAGNOSIS — Z7189 Other specified counseling: Secondary | ICD-10-CM | POA: Diagnosis not present

## 2020-02-16 DIAGNOSIS — Z79899 Other long term (current) drug therapy: Secondary | ICD-10-CM | POA: Diagnosis not present

## 2020-02-20 DIAGNOSIS — E1169 Type 2 diabetes mellitus with other specified complication: Secondary | ICD-10-CM | POA: Diagnosis not present

## 2020-03-23 DIAGNOSIS — E1169 Type 2 diabetes mellitus with other specified complication: Secondary | ICD-10-CM | POA: Diagnosis not present

## 2020-04-22 DIAGNOSIS — E1169 Type 2 diabetes mellitus with other specified complication: Secondary | ICD-10-CM | POA: Diagnosis not present

## 2020-05-03 DIAGNOSIS — H18513 Endothelial corneal dystrophy, bilateral: Secondary | ICD-10-CM | POA: Diagnosis not present

## 2020-05-03 DIAGNOSIS — H3581 Retinal edema: Secondary | ICD-10-CM | POA: Diagnosis not present

## 2020-05-03 DIAGNOSIS — Z961 Presence of intraocular lens: Secondary | ICD-10-CM | POA: Diagnosis not present

## 2020-05-03 DIAGNOSIS — H31092 Other chorioretinal scars, left eye: Secondary | ICD-10-CM | POA: Diagnosis not present

## 2020-05-03 DIAGNOSIS — E113293 Type 2 diabetes mellitus with mild nonproliferative diabetic retinopathy without macular edema, bilateral: Secondary | ICD-10-CM | POA: Diagnosis not present

## 2020-05-03 DIAGNOSIS — H26493 Other secondary cataract, bilateral: Secondary | ICD-10-CM | POA: Diagnosis not present

## 2020-05-05 DIAGNOSIS — E1142 Type 2 diabetes mellitus with diabetic polyneuropathy: Secondary | ICD-10-CM | POA: Diagnosis not present

## 2020-05-05 DIAGNOSIS — I10 Essential (primary) hypertension: Secondary | ICD-10-CM | POA: Diagnosis not present

## 2020-05-05 DIAGNOSIS — E1165 Type 2 diabetes mellitus with hyperglycemia: Secondary | ICD-10-CM | POA: Diagnosis not present

## 2020-05-05 DIAGNOSIS — I779 Disorder of arteries and arterioles, unspecified: Secondary | ICD-10-CM | POA: Diagnosis not present

## 2020-05-05 DIAGNOSIS — Z299 Encounter for prophylactic measures, unspecified: Secondary | ICD-10-CM | POA: Diagnosis not present

## 2020-05-22 DIAGNOSIS — E1169 Type 2 diabetes mellitus with other specified complication: Secondary | ICD-10-CM | POA: Diagnosis not present

## 2020-06-22 DIAGNOSIS — E1169 Type 2 diabetes mellitus with other specified complication: Secondary | ICD-10-CM | POA: Diagnosis not present

## 2020-07-03 DIAGNOSIS — Z23 Encounter for immunization: Secondary | ICD-10-CM | POA: Diagnosis not present

## 2020-07-07 DIAGNOSIS — R202 Paresthesia of skin: Secondary | ICD-10-CM | POA: Diagnosis not present

## 2020-07-07 DIAGNOSIS — I10 Essential (primary) hypertension: Secondary | ICD-10-CM | POA: Diagnosis not present

## 2020-07-07 DIAGNOSIS — E1165 Type 2 diabetes mellitus with hyperglycemia: Secondary | ICD-10-CM | POA: Diagnosis not present

## 2020-07-07 DIAGNOSIS — Z2821 Immunization not carried out because of patient refusal: Secondary | ICD-10-CM | POA: Diagnosis not present

## 2020-07-07 DIAGNOSIS — E1142 Type 2 diabetes mellitus with diabetic polyneuropathy: Secondary | ICD-10-CM | POA: Diagnosis not present

## 2020-07-07 DIAGNOSIS — Z299 Encounter for prophylactic measures, unspecified: Secondary | ICD-10-CM | POA: Diagnosis not present

## 2020-07-22 DIAGNOSIS — E1169 Type 2 diabetes mellitus with other specified complication: Secondary | ICD-10-CM | POA: Diagnosis not present

## 2020-08-11 ENCOUNTER — Encounter: Payer: Self-pay | Admitting: Cardiology

## 2020-08-11 ENCOUNTER — Other Ambulatory Visit: Payer: Self-pay

## 2020-08-11 ENCOUNTER — Ambulatory Visit: Payer: Medicare Other | Admitting: Cardiology

## 2020-08-11 VITALS — BP 146/63 | HR 83 | Temp 97.4°F | Resp 18 | Ht <= 58 in | Wt 115.0 lb

## 2020-08-11 DIAGNOSIS — I739 Peripheral vascular disease, unspecified: Secondary | ICD-10-CM | POA: Diagnosis not present

## 2020-08-11 DIAGNOSIS — I10 Essential (primary) hypertension: Secondary | ICD-10-CM | POA: Diagnosis not present

## 2020-08-11 MED ORDER — AMLODIPINE BESYLATE 5 MG PO TABS: 10.0000 mg | ORAL_TABLET | Freq: Every day | ORAL | 3 refills | Status: DC

## 2020-08-11 NOTE — Progress Notes (Signed)
Primary Physician/Referring:  Glenda Chroman, MD  Patient ID: Brianna Hendricks, female    DOB: March 25, 1941, 80 y.o.   MRN: [id number]  Chief Complaint  Patient presents with  . Upper Leg Pain  . Follow-up    1 year   HPI:    HPI: Brianna Hendricks  is a 80 y.o. with history of chronic LBBB, hypertension, hyperlipidemia, diabetes mellitus and PAD underwent peripheral arteriogram on 11/19/2017 with revascularization of high grade stenosis of left iliac artery, presents for follow-up of claudication.  She made an appointment to see [place] as she has started to have pain in her left hip especially in the back and also radiating down to her knee.  She thought that she has had recurrence of "blockages".  Occurs when she sits and also stands for period of time but denies any cramping in her calves with activity.  Symptoms started about 2 months ago.  She has also noticed tingling and numbness in hands and feet over the past 2 to 3 months. Remains active and is walking daily without chest pain or dyspnea.   Past Medical History:  Diagnosis Date  . Arthritis    "some in my" 4th digit right hand  (12/25/2017)  . High cholesterol   . History of blood transfusion    "related to female bleeding"  . Hypertension   . PAD (peripheral artery disease) ([place])   . Stroke [facility]) 1998   "fully recovered" (12/25/2017)  . Type II diabetes mellitus ([place])    Past Surgical History:  Procedure Laterality Date  . ABDOMINAL AORTOGRAM N/A 12/25/2017   Procedure: ABDOMINAL AORTOGRAM;  Surgeon: Adrian Prows, MD;  Location: [place] [facility];  Service: Cardiovascular;  Laterality: N/A;  . CATARACT EXTRACTION W/ INTRAOCULAR LENS  IMPLANT, BILATERAL Bilateral   . CHOLECYSTECTOMY    . LOWER EXTREMITY ANGIOGRAPHY Bilateral 12/25/2017   Procedure: LOWER EXTREMITY ANGIOGRAPHY;  Surgeon: Adrian Prows, MD;  Location: [place] [facility];  Service: Cardiovascular;  Laterality: Bilateral;  . PERIPHERAL VASCULAR  ATHERECTOMY Left 12/25/2017   Procedure: PERIPHERAL VASCULAR ATHERECTOMY;  Surgeon: Adrian Prows, MD;  Location: [facility];  Service: Cardiovascular;  Laterality: Left;  common iliac  . PERIPHERAL VASCULAR INTERVENTION Left 12/25/2017   Procedure: PERIPHERAL VASCULAR INTERVENTION;  Surgeon: Adrian Prows, MD;  Location: [place] [facility];  Service: Cardiovascular;  Laterality: Left;  common iliac  . THYROID SURGERY  1972   " not sure what was done:"  . TONSILLECTOMY     Social History   Tobacco Use  . Smoking status: Never Smoker  . Smokeless tobacco: Never Used  Substance Use Topics  . Alcohol use: Never  Marital Status: Married    ROS  Review of Systems  Cardiovascular: Positive for claudication (left leg ). Negative for chest pain, dyspnea on exertion and leg swelling.  Musculoskeletal: Positive for arthritis.  Gastrointestinal: Negative for melena.  Neurological: Positive for paresthesias (feet and hands).   Objective  Blood pressure (!) 146/63, pulse 83, temperature (!) 97.4 F (36.3 C), temperature source Temporal, resp. rate 18, height '4\' 10"'  (1.473 m), weight 115 lb (52.2 kg), SpO2 100 %. Body mass index is 24.04 kg/m.   Physical Exam Vitals reviewed.  Constitutional:      Appearance: She is well-developed.  Cardiovascular:     Rate and Rhythm: Normal rate and regular rhythm.     Pulses: Intact distal pulses.          Carotid pulses are  2+ on the right side with bruit and 2+ on the left side with bruit.      Femoral pulses are 2+ on the right side and 2+ on the left side.      Popliteal pulses are 2+ on the right side and 2+ on the left side.       Dorsalis pedis pulses are 2+ on the right side and 2+ on the left side.       Posterior tibial pulses are 1+ on the right side and 1+ on the left side.     Heart sounds: Normal heart sounds.     Comments: No leg edema,  No JVD. Pulmonary:     Effort: Pulmonary effort is normal. No accessory muscle usage or respiratory  distress.     Breath sounds: Normal breath sounds.  Abdominal:     General: Bowel sounds are normal.     Palpations: Abdomen is soft.  Musculoskeletal:        General: Normal range of motion.     Cervical back: Normal range of motion.  Skin:    General: Skin is warm and dry.  Neurological:     General: No focal deficit present.     Mental Status: She is alert and oriented to person, place, and time.    Radiology: No results found.  Laboratory examination:   12/13/2017: Glucose 211, creatinine 0.86, EGFR 67/77, potassium 5.3, BMP otherwise normal. CBC normal.  12/20/2016: Cholesterol 178, triglycerides 315, HDL 55, LDL 60. TSH 2.9. Hemoglobin 9.2, hematocrit 29.2, microcytic indices, CBC otherwise normal. Creatinine 0.7, EGFR 86/99, potassium 5.4, CMP otherwise normal.  No results for input(s): NA, K, CL, CO2, GLUCOSE, BUN, CREATININE, CALCIUM, GFRNONAA, GFRAA in the last 8760 hours. CMP Latest Ref Rng & Units 12/25/2017 02/26/2007  Glucose 70 - 99 mg/dL - 149(H)  BUN 6 - 23 mg/dL - 14  Creatinine 0.40 - 1.20 mg/dL - 0.81  Sodium 135 - 145 mEq/L - 135  Potassium 3.5 - 5.1 mmol/L 4.0 4.0  Chloride 96 - 112 mEq/L - 99  CO2 19 - 32 mEq/L - 23  Calcium 8.4 - 10.5 mg/dL - 9.3  Total Protein 6.0 - 8.3 g/dL - 7.5  Total Bilirubin 0.3 - 1.2 mg/dL - 0.3  Alkaline Phos 39 - 117 U/L - 96  AST 0 - 37 U/L - 19  ALT 0 - 35 U/L - 19   CBC Latest Ref Rng & Units 02/26/2007  WBC 3.9 - 10.0 10e3/uL 6.9  Hemoglobin 11.6 - 15.9 g/dL 11.1(L)  Hematocrit 34.8 - 46.6 % 32.6(L)  Platelets 145 - 400 10e3/uL 236   Lipid Panel  No results found for: CHOL, TRIG, HDL, CHOLHDL, VLDL, LDLCALC, LDLDIRECT HEMOGLOBIN A1C No results found for: HGBA1C, MPG TSH No results for input(s): TSH in the last 8760 hours.   Medications   Current Outpatient Medications on File Prior to Visit  Medication Sig Dispense Refill  . aspirin EC 81 MG tablet Take 81 mg by mouth daily.    . Calcium Carbonate-Vitamin D  600-400 MG-UNIT tablet Take 1 tablet by mouth 2 (two) times daily.    . carbamazepine (TEGRETOL) 200 MG tablet Take 200 mg by mouth 2 (two) times daily.    . folic acid (FOLVITE) 1 MG tablet Take 1 mg by mouth every evening.    Marland Kitchen glipiZIDE (GLUCOTROL) 5 MG tablet Take by mouth 2 (two) times daily.    Marland Kitchen losartan (COZAAR) 100 MG tablet Take 50 mg  by mouth daily.    . metFORMIN (GLUCOPHAGE) 500 MG tablet Take 500 mg by mouth 2 (two) times daily with a meal.    . pregabalin (LYRICA) 75 MG capsule Take 75 mg by mouth daily.     . rosuvastatin (CRESTOR) 5 MG tablet Take 1 tablet by mouth at bedtime.     No current facility-administered medications on file prior to visit.     Cardiac Studies:   Echo- 04/24/13 1.Left ventricular cavity is normal in size.  Abnormal septal motion due to IVCD.  Lower limits systolic global function.   Calculated EF 55%.   Visual EF is 50-55%.   Doppler evidence of Grade I (impaired) diastolic dysfunction. 2.Left atrial cavity is slightly dilated. 3.Mild calcification of the mitral annulus.   Trace mitral regurgitation.   Mitral valve inflow A > E ratio. 4.Tricuspid valve structurally normal.   Mild tricuspid regurgitation.  Lexiscan Myoview stress test 04/25/2013: 1. Resting EKG NSR, LBBB. Stress EKG was non diagnostic for ischemia. No ST-T changes of ischemia noted with pharmacologic stress testing. Stress symptoms included chest pressure, sob, stomach discomfort and headache. Stress terminated due to completion of protocol. 2. The perfusion study demonstrated mild breast attenuation artifact in the anterior wall. There was no evidence of ischemia or scar. Dynamic gated images reveal normal wall motion and endocardial thickening. Left ventricular ejection fraction was estimated to be 69%.  This represents a low risk study.  Carotid artery duplex 12/21/2017: No significant stenosis right ICA. 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 Antegrade right vertebral artery flow. Antegrade left vertebral artery flow. Follow up in one year or if clinically indicated. No significant change from 05/01/2013.  Peripheral arteriogram 12/25/2017: Left common iliac artery tandem 99% calcific stenosis S/P 2.0 solid crown CSI orbital atherectomy and Viabahn. Left AT appears occluded distally. Otherwise mild disease bilaterally.  ABI 01/23/2018: Moderately abnormal waveforms of the right ankle. Mildly abnormal waveforms of the left ankle. Mildly decreased right resting ABI. Mildly decreased left resting ABI. Compared to ABI 11/19/2017, normal ABI right, left was 0.7.  EKG:   EKG 08/11/2020: Normal sinus rhythm at rate of 72 bpm. Atypical left bundle branch block.  No significant change from May 09, 2019.  Assessment     ICD-10-CM   1. PAD (peripheral artery disease) (HCC)  I73.9   2. Primary hypertension  I10 EKG 12-Lead    amLODipine (NORVASC) 5 MG tablet  3. Claudication in peripheral vascular disease (HCC)  I73.9 PCV AORTA DUPLEX    PCV LOWER ARTERIAL W ABI (UNILATERAL)   Meds ordered this encounter  Medications  . amLODipine (NORVASC) 5 MG tablet    Sig: Take 2 tablets (10 mg total) by mouth daily.    Dispense:  90 tablet    Refill:  3   Medications Discontinued During This Encounter  Medication Reason  . atorvastatin (LIPITOR) 10 MG tablet Change in therapy  . ferrous sulfate 325 (65 FE) MG tablet Patient Preference  . cholecalciferol (VITAMIN D) 1000 units tablet Patient Preference  . empagliflozin (JARDIANCE) 10 MG TABS tablet Patient Preference  . amLODipine (NORVASC) 10 MG tablet Dose change  . amLODipine (NORVASC) 5 MG tablet Reorder    Orders Placed This Encounter  Procedures  . EKG 12-Lead      Recommendations:   Mrs. Inaya Gillham is a 80 y.o. 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  She made an appointment to see [place] as she has started to have pain in her left hip especially in the back and also radiating down to her knee.  She thought that she has had recurrence of "blockages".  Occurs when she sits and also stands for period of time but denies any cramping in her calves with activity.  Symptoms started about 2 months ago.  She has also noticed tingling and numbness in hands and feet over the past 2 to 3 months.  Her physical examination does not appear to suggest progression of vascular disease and leg raising did not elicit any symptoms to suggest sciatica.  Her pain is clearly in the distribution of sciatic nerve on the left leg.  However in view of significant cardiac risk factors including diabetes which recently has been uncontrolled, hypertension and hyperlipidemia and known stenting to her left iliac artery, will repeat abdominal aortic duplex to evaluate iliac artery stenosis and lower extremity arterial duplex with ABI.  Blood pressure is elevated today, will increase amlodipine from 5 mg to 10 mg daily.  Otherwise on appropriate medical therapy, I will see her back in 6 weeks for follow-up.   Adrian Prows, MD, [facility] 08/11/2020, 6:24 PM Office: [phone] Pager: [phone]

## 2020-08-16 ENCOUNTER — Ambulatory Visit: Payer: Medicare Other | Admitting: Cardiology

## 2020-08-23 DIAGNOSIS — E1169 Type 2 diabetes mellitus with other specified complication: Secondary | ICD-10-CM | POA: Diagnosis not present

## 2020-08-23 DIAGNOSIS — G4710 Hypersomnia, unspecified: Secondary | ICD-10-CM | POA: Diagnosis not present

## 2020-08-23 DIAGNOSIS — E1165 Type 2 diabetes mellitus with hyperglycemia: Secondary | ICD-10-CM | POA: Diagnosis not present

## 2020-08-23 DIAGNOSIS — R5383 Other fatigue: Secondary | ICD-10-CM | POA: Diagnosis not present

## 2020-08-23 DIAGNOSIS — Z79899 Other long term (current) drug therapy: Secondary | ICD-10-CM | POA: Diagnosis not present

## 2020-08-23 DIAGNOSIS — I10 Essential (primary) hypertension: Secondary | ICD-10-CM | POA: Diagnosis not present

## 2020-08-23 DIAGNOSIS — R569 Unspecified convulsions: Secondary | ICD-10-CM | POA: Diagnosis not present

## 2020-08-23 DIAGNOSIS — Z299 Encounter for prophylactic measures, unspecified: Secondary | ICD-10-CM | POA: Diagnosis not present

## 2020-08-31 DIAGNOSIS — H31092 Other chorioretinal scars, left eye: Secondary | ICD-10-CM | POA: Diagnosis not present

## 2020-08-31 DIAGNOSIS — H3581 Retinal edema: Secondary | ICD-10-CM | POA: Diagnosis not present

## 2020-08-31 DIAGNOSIS — E113293 Type 2 diabetes mellitus with mild nonproliferative diabetic retinopathy without macular edema, bilateral: Secondary | ICD-10-CM | POA: Diagnosis not present

## 2020-08-31 DIAGNOSIS — Z961 Presence of intraocular lens: Secondary | ICD-10-CM | POA: Diagnosis not present

## 2020-08-31 DIAGNOSIS — H18513 Endothelial corneal dystrophy, bilateral: Secondary | ICD-10-CM | POA: Diagnosis not present

## 2020-08-31 DIAGNOSIS — H26493 Other secondary cataract, bilateral: Secondary | ICD-10-CM | POA: Diagnosis not present

## 2020-09-16 ENCOUNTER — Ambulatory Visit: Payer: Medicare Other

## 2020-09-16 ENCOUNTER — Other Ambulatory Visit: Payer: Self-pay

## 2020-09-16 DIAGNOSIS — I739 Peripheral vascular disease, unspecified: Secondary | ICD-10-CM

## 2020-09-16 DIAGNOSIS — M79605 Pain in left leg: Secondary | ICD-10-CM | POA: Diagnosis not present

## 2020-09-20 DIAGNOSIS — E1169 Type 2 diabetes mellitus with other specified complication: Secondary | ICD-10-CM | POA: Diagnosis not present

## 2020-09-22 ENCOUNTER — Other Ambulatory Visit: Payer: Self-pay

## 2020-09-22 ENCOUNTER — Ambulatory Visit: Payer: Medicare Other | Admitting: Cardiology

## 2020-09-22 ENCOUNTER — Encounter: Payer: Self-pay | Admitting: Cardiology

## 2020-09-22 VITALS — BP 146/68 | HR 82 | Temp 97.7°F | Resp 18 | Ht <= 58 in | Wt 112.6 lb

## 2020-09-22 DIAGNOSIS — E1142 Type 2 diabetes mellitus with diabetic polyneuropathy: Secondary | ICD-10-CM | POA: Diagnosis not present

## 2020-09-22 DIAGNOSIS — I10 Essential (primary) hypertension: Secondary | ICD-10-CM | POA: Diagnosis not present

## 2020-09-22 DIAGNOSIS — M48062 Spinal stenosis, lumbar region with neurogenic claudication: Secondary | ICD-10-CM | POA: Diagnosis not present

## 2020-09-22 DIAGNOSIS — E782 Mixed hyperlipidemia: Secondary | ICD-10-CM | POA: Diagnosis not present

## 2020-09-22 DIAGNOSIS — I739 Peripheral vascular disease, unspecified: Secondary | ICD-10-CM | POA: Diagnosis not present

## 2020-09-22 MED ORDER — AMLODIPINE BESYLATE 10 MG PO TABS: 10.0000 mg | ORAL_TABLET | Freq: Every day | ORAL | 3 refills | Status: DC

## 2020-09-22 NOTE — Patient Instructions (Signed)
Sciatica  Sciatica is pain, weakness, tingling, or loss of feeling (numbness) along the sciatic nerve. The sciatic nerve starts in the lower back and goes down the back of each leg. Sciatica usually goes away on its own or with treatment. Sometimes, sciatica may come back (recur). What are the causes? This condition happens when the sciatic nerve is pinched or has pressure put on it. This may be the result of:  A disk in between the bones of the spine bulging out too far (herniated disk).  Changes in the spinal disks that occur with aging.  A condition that affects a muscle in the butt.  Extra bone growth near the sciatic nerve.  A break (fracture) of the area between your hip bones (pelvis).  Pregnancy.  Tumor. This is rare. What increases the risk? You are more likely to develop this condition if you:  Play sports that put pressure or stress on the spine.  Have poor strength and ease of movement (flexibility).  Have had a back injury in the past.  Have had back surgery.  Sit for long periods of time.  Do activities that involve bending or lifting over and over again.  Are very overweight (obese). What are the signs or symptoms? Symptoms can vary from mild to very bad. They may include:  Any of these problems in the lower back, leg, hip, or butt: ? Mild tingling, loss of feeling, or dull aches. ? Burning sensations. ? Sharp pains.  Loss of feeling in the back of the calf or the sole of the foot.  Leg weakness.  Very bad back pain that makes it hard to move. These symptoms may get worse when you cough, sneeze, or laugh. They may also get worse when you sit or stand for long periods of time. How is this treated? This condition often gets better without any treatment. However, treatment may include:  Changing or cutting back on physical activity when you have pain.  Doing exercises and stretching.  Putting ice or heat on the affected area.  Medicines that  help: ? To relieve pain and swelling. ? To relax your muscles.  Shots (injections) of medicines that help to relieve pain, irritation, and swelling.  Surgery. Follow these instructions at home: Medicines  Take over-the-counter and prescription medicines only as told by your doctor.  Ask your doctor if the medicine prescribed to you: ? Requires you to avoid driving or using heavy machinery. ? Can cause trouble pooping (constipation). You may need to take these steps to prevent or treat trouble pooping:  Drink enough fluids to keep your pee (urine) pale yellow.  Take over-the-counter or prescription medicines.  Eat foods that are high in fiber. These include beans, whole grains, and fresh fruits and vegetables.  Limit foods that are high in fat and sugar. These include fried or sweet foods. Managing pain  If told, put ice on the affected area. ? Put ice in a plastic bag. ? Place a towel between your skin and the bag. ? Leave the ice on for 20 minutes, 2-3 times a day.  If told, put heat on the affected area. Use the heat source that your doctor tells you to use, such as a moist heat pack or a heating pad. ? Place a towel between your skin and the heat source. ? Leave the heat on for 20-30 minutes. ? Remove the heat if your skin turns bright red. This is very important if you are unable to feel pain,   heat, or cold. You may have a greater risk of getting burned.      Activity  Return to your normal activities as told by your doctor. Ask your doctor what activities are safe for you.  Avoid activities that make your symptoms worse.  Take short rests during the day. ? When you rest for a long time, do some physical activity or stretching between periods of rest. ? Avoid sitting for a long time without moving. Get up and move around at least one time each hour.  Exercise and stretch regularly, as told by your doctor.  Do not lift anything that is heavier than 10 lb (4.5 kg)  while you have symptoms of sciatica. ? Avoid lifting heavy things even when you do not have symptoms. ? Avoid lifting heavy things over and over.  When you lift objects, always lift in a way that is safe for your body. To do this, you should: ? Bend your knees. ? Keep the object close to your body. ? Avoid twisting.   General instructions  Stay at a healthy weight.  Wear comfortable shoes that support your feet. Avoid wearing high heels.  Avoid sleeping on a mattress that is too soft or too hard. You might have less pain if you sleep on a mattress that is firm enough to support your back.  Keep all follow-up visits as told by your doctor. This is important. Contact a doctor if:  You have pain that: ? Wakes you up when you are sleeping. ? Gets worse when you lie down. ? Is worse than the pain you have had in the past. ? Lasts longer than 4 weeks.  You lose weight without trying. Get help right away if:  You cannot control when you pee (urinate) or poop (have a bowel movement).  You have weakness in any of these areas and it gets worse: ? Lower back. ? The area between your hip bones. ? Butt. ? Legs.  You have redness or swelling of your back.  You have a burning feeling when you pee. Summary  Sciatica is pain, weakness, tingling, or loss of feeling (numbness) along the sciatic nerve.  This condition happens when the sciatic nerve is pinched or has pressure put on it.  Sciatica can cause pain, tingling, or loss of feeling (numbness) in the lower back, legs, hips, and butt.  Treatment often includes rest, exercise, medicines, and putting ice or heat on the affected area. This information is not intended to replace advice given to you by your health care provider. Make sure you discuss any questions you have with your health care provider. Document Revised: 07/29/2018 Document Reviewed: 07/29/2018 Elsevier Patient Education  2021 [employer] Inc.  

## 2020-09-22 NOTE — Progress Notes (Signed)
Primary Physician/Referring:  Glenda Chroman, MD  Patient ID: Brianna Hendricks, female    DOB: April 05, 1941, 80 y.o.   MRN: [id number]  Chief Complaint  Patient presents with  . PAD  . Hypertension  . Follow-up    6 weeks   HPI:    HPI: Brianna Hendricks  is a 80 y.o. with history of chronic LBBB, hypertension, hyperlipidemia, diabetes mellitus and PAD underwent peripheral arteriogram on 11/19/2017 with revascularization of high grade stenosis of left iliac artery, presents for follow-up of claudication.  She made an appointment to see [place] as she has started to have pain in her left hip especially in the back and also radiating down to her knee.  She thought that she has had recurrence of "blockages".  Occurs when she sits and also stands for period of time but denies any cramping in her calves with activity.  Symptoms started about 2 months ago.  She has also noticed tingling and numbness in hands and feet over the past 2 to 3 months. Remains active and is walking daily without chest pain or dyspnea.  She underwent abdominal aortic duplex and lower extremity arterial duplex and presents for follow-up.  No change in symptoms.  Past Medical History:  Diagnosis Date  . Arthritis    "some in my" 4th digit right hand  (12/25/2017)  . High cholesterol   . History of blood transfusion    "related to female bleeding"  . Hypertension   . PAD (peripheral artery disease) ([place])   . Stroke [facility]) 1998   "fully recovered" (12/25/2017)  . Type II diabetes mellitus ([place])    Past Surgical History:  Procedure Laterality Date  . ABDOMINAL AORTOGRAM N/A 12/25/2017   Procedure: ABDOMINAL AORTOGRAM;  Surgeon: Adrian Prows, MD;  Location: [place] [facility];  Service: Cardiovascular;  Laterality: N/A;  . CATARACT EXTRACTION W/ INTRAOCULAR LENS  IMPLANT, BILATERAL Bilateral   . CHOLECYSTECTOMY    . LOWER EXTREMITY ANGIOGRAPHY Bilateral 12/25/2017   Procedure: LOWER EXTREMITY ANGIOGRAPHY;   Surgeon: Adrian Prows, MD;  Location: [place] [facility];  Service: Cardiovascular;  Laterality: Bilateral;  . PERIPHERAL VASCULAR ATHERECTOMY Left 12/25/2017   Procedure: PERIPHERAL VASCULAR ATHERECTOMY;  Surgeon: Adrian Prows, MD;  Location: [place] [facility];  Service: Cardiovascular;  Laterality: Left;  common iliac  . PERIPHERAL VASCULAR INTERVENTION Left 12/25/2017   Procedure: PERIPHERAL VASCULAR INTERVENTION;  Surgeon: Adrian Prows, MD;  Location: [place] [facility];  Service: Cardiovascular;  Laterality: Left;  common iliac  . THYROID SURGERY  1972   " not sure what was done:"  . TONSILLECTOMY     Social History   Tobacco Use  . Smoking status: Never Smoker  . Smokeless tobacco: Never Used  Substance Use Topics  . Alcohol use: Never  Marital Status: Married    ROS  Review of Systems  Cardiovascular: Positive for claudication (left leg ). Negative for chest pain, dyspnea on exertion and leg swelling.  Musculoskeletal: Positive for arthritis.  Gastrointestinal: Negative for melena.  Neurological: Positive for paresthesias (feet and hands).   Objective  Blood pressure (!) 146/68, pulse 82, temperature 97.7 F (36.5 C), temperature source Temporal, resp. rate 18, height 4' 10" (1.473 m), weight 112 lb 9.6 oz (51.1 kg), SpO2 98 %. Body mass index is 23.53 kg/m.   Vitals with BMI 09/22/2020 08/11/2020 05/09/2019  Height 4' 10" 4' 10" 4' 10"  Weight 112 lbs 10 oz 115 lbs 112 lbs 13 oz  BMI  23.54 17.61 60.73  Systolic 710 626 948  Diastolic 68 63 66  Pulse 82 83 91      Physical Exam Vitals reviewed.  Constitutional:      Appearance: She is well-developed.  Cardiovascular:     Rate and Rhythm: Normal rate and regular rhythm.     Pulses: Intact distal pulses.          Carotid pulses are 2+ on the right side with bruit and 2+ on the left side with bruit.      Femoral pulses are 2+ on the right side and 2+ on the left side.      Popliteal pulses are 2+ on the right side and 2+ on  the left side.       Dorsalis pedis pulses are 2+ on the right side and 2+ on the left side.       Posterior tibial pulses are 1+ on the right side and 1+ on the left side.     Heart sounds: Normal heart sounds.     Comments: No leg edema,  No JVD. Pulmonary:     Effort: Pulmonary effort is normal. No accessory muscle usage or respiratory distress.     Breath sounds: Normal breath sounds.  Abdominal:     General: Bowel sounds are normal.     Palpations: Abdomen is soft.  Musculoskeletal:        General: Normal range of motion.     Cervical back: Normal range of motion.  Skin:    General: Skin is warm and dry.  Neurological:     General: No focal deficit present.     Mental Status: She is alert and oriented to person, place, and time.    Radiology: No results found.  Laboratory examination:   Cholesterol, total 195.000 M 02/16/2020 HDL 57.000 MG 02/16/2020 LDL 90.8 Triglycerides 236.000 M 02/16/2020  Hemoglobin 11.900 G/ 02/16/2020 Platelets 222.000 X1 02/16/2020  TSH 3.070 02/16/2020  Creatinine, Serum 0.910 MG/ 02/16/2020 Potassium 4.000 mm 12/25/2017 ALT (SGPT) 35.000 IU/ 02/16/2020  12/13/2017: Glucose 211, creatinine 0.86, EGFR 67/77, potassium 5.3, BMP otherwise normal. CBC normal.  12/20/2016: Cholesterol 178, triglycerides 315, HDL 55, LDL 60. TSH 2.9. Hemoglobin 9.2, hematocrit 29.2, microcytic indices, CBC otherwise normal. Creatinine 0.7, EGFR 86/99, potassium 5.4, CMP otherwise normal.   Medications   Current Outpatient Medications on File Prior to Visit  Medication Sig Dispense Refill  . aspirin EC 81 MG tablet Take 81 mg by mouth daily.    . Calcium Carbonate-Vitamin D 600-400 MG-UNIT tablet Take 1 tablet by mouth 2 (two) times daily.    . carbamazepine (TEGRETOL) 200 MG tablet Take 200 mg by mouth 2 (two) times daily.    . folic acid (FOLVITE) 1 MG tablet Take 1 mg by mouth every evening.    Marland Kitchen glipiZIDE (GLUCOTROL) 5 MG tablet Take by mouth 2 (two) times daily.     Marland Kitchen losartan (COZAAR) 100 MG tablet Take 50 mg by mouth daily.    . metFORMIN (GLUCOPHAGE) 500 MG tablet Take 500 mg by mouth 2 (two) times daily with a meal.    . pregabalin (LYRICA) 75 MG capsule Take 75 mg by mouth daily.     . rosuvastatin (CRESTOR) 5 MG tablet Take 1 tablet by mouth at bedtime.     No current facility-administered medications on file prior to visit.     Cardiac Studies:   Echo- 04/24/13 1.Left ventricular cavity is normal in size.  Abnormal septal motion due  to IVCD.  Lower limits systolic global function.   Calculated EF 55%.   Visual EF is 50-55%.   Doppler evidence of Grade I (impaired) diastolic dysfunction. 2.Left atrial cavity is slightly dilated. 3.Mild calcification of the mitral annulus.   Trace mitral regurgitation.   Mitral valve inflow A > E ratio. 4.Tricuspid valve structurally normal.   Mild tricuspid regurgitation.  Lexiscan Myoview stress test 04/25/2013: 1. Resting EKG NSR, LBBB. Stress EKG was non diagnostic for ischemia. No ST-T changes of ischemia noted with pharmacologic stress testing. Stress symptoms included chest pressure, sob, stomach discomfort and headache. Stress terminated due to completion of protocol. 2. The perfusion study demonstrated mild breast attenuation artifact in the anterior wall. There was no evidence of ischemia or scar. Dynamic gated images reveal normal wall motion and endocardial thickening. Left ventricular ejection fraction was estimated to be 69%.  This represents a low risk study.  Carotid artery duplex 12/21/2017: No significant stenosis right ICA. 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 Antegrade right vertebral artery flow. Antegrade left vertebral artery flow. Follow up in one year or if clinically indicated. No significant change from 05/01/2013.  Peripheral arteriogram 12/25/2017: Left common iliac artery tandem 99% calcific stenosis S/P 2.0 solid crown CSI orbital atherectomy  and Viabahn. Left AT appears occluded distally. Otherwise mild disease bilaterally.  ABI 01/23/2018: Moderately abnormal waveforms of the right ankle. Mildly abnormal waveforms of the left ankle. Mildly decreased right resting ABI. Mildly decreased left resting ABI. Compared to ABI 11/19/2017, normal ABI right, left was 0.7.  Abdominal Aortic Duplex 09/16/2020: The maximum aorta  diameter is 1 cm (prox). Severe plaque observed in the proximal, mid and distal aorta. Severe plaque observed in the right internal iliac and left internal iliac arteries.  Velocity is moderately elevated throughout the abdominal aorta and iliac vessels. No focal stenosis identified.  Clinical correlation recommended. Consider catheter directed angiography to evaluate the abdominal aortic stenosis.  Lower Extremity Arterial Duplex 09/16/2020: No hemodynamically significant stenoses are identified in the bilateral lower extremity arterial system.   This exam reveals moderately decreased perfusion of the right lower extremity, noted at the anterior tibial and post tibial artery level (ABI 0.68) and moderately decreased perfusion of the left lower extremity, noted at the anterior tibial and post tibial artery level (ABI 0.65).  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  EKG:   EKG 08/11/2020: Normal sinus rhythm at rate of 72 bpm. Atypical left bundle branch block.  No significant change from May 09, 2019.  Assessment     ICD-10-CM   1. PAD (peripheral artery disease) (HCC)  I73.9   2. Claudication in peripheral vascular disease (HCC)  I73.9   3. Pseudoclaudication  M48.062   4. Type 2 diabetes mellitus with diabetic polyneuropathy, without long-term current use of insulin (HCC)  E11.42   5. Mixed hyperlipidemia  E78.2   6. Primary hypertension  I10 amLODipine (NORVASC) 10 MG tablet   Meds ordered this encounter  Medications  . amLODipine (NORVASC) 10 MG tablet    Sig: Take 1 tablet (10 mg total)  by mouth daily.    Dispense:  90 tablet    Refill:  3   Medications Discontinued During This Encounter  Medication Reason  . amLODipine (NORVASC) 5 MG tablet Reorder    No orders of the defined types were placed in this encounter.   Recommendations:   Mrs. Sophiana Milanese is a 80 y.o. 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  She made an appointment to see [place] as she has started to have pain in her left hip especially in the back and also radiating down to her knee.  She thought that she has had recurrence of "blockages".  Occurs when she sits and also stands for period of time but denies any cramping in her calves with activity.  Symptoms started about 3 months ago.  I reviewed the results of the abdominal duplex and lower extremity arterial Dopplers, she has severe diffuse aortic atherosclerosis and narrowing of the abdominal aorta however her symptoms are located only on the left hands abdominal aortic stenosis leading to claudication is less likely.  Also Dopplers of the lower extremity revealed small vessel disease.  Hence I suspect her symptoms are probably related to either lumbosacral sciatica or may be related to peripheral neuropathy from diabetes mellitus.  With regard to hyperlipidemia, LDL is not at goal.  Would consider increasing her Crestor to 10 mg or addition of Zetia to get LDL <70.  On her last office visit due to hypertension I will increase the dose of amlodipine to 10 mg, she is tolerating this but still systolic blood pressure is elevated.  Would recommend trying hydrochlorothiazide or changing losartan to valsartan or olmesartan.  I will forward this note to Dr. Jerene Bears for his consideration of the above.  I will see her back in 6 months for  follow-up.   Adrian Prows, MD, [facility] 09/22/2020, 7:03 PM Office: [phone] Pager: [phone]

## 2020-10-01 DIAGNOSIS — G9519 Other vascular myelopathies: Secondary | ICD-10-CM | POA: Diagnosis not present

## 2020-10-01 DIAGNOSIS — E1165 Type 2 diabetes mellitus with hyperglycemia: Secondary | ICD-10-CM | POA: Diagnosis not present

## 2020-10-01 DIAGNOSIS — Z299 Encounter for prophylactic measures, unspecified: Secondary | ICD-10-CM | POA: Diagnosis not present

## 2020-10-01 DIAGNOSIS — M79605 Pain in left leg: Secondary | ICD-10-CM | POA: Diagnosis not present

## 2020-10-01 DIAGNOSIS — I779 Disorder of arteries and arterioles, unspecified: Secondary | ICD-10-CM | POA: Diagnosis not present

## 2020-10-06 ENCOUNTER — Other Ambulatory Visit (HOSPITAL_COMMUNITY): Payer: Self-pay | Admitting: Internal Medicine

## 2020-10-06 ENCOUNTER — Other Ambulatory Visit: Payer: Self-pay | Admitting: Internal Medicine

## 2020-10-06 DIAGNOSIS — M545 Low back pain, unspecified: Secondary | ICD-10-CM

## 2020-10-11 ENCOUNTER — Other Ambulatory Visit: Payer: Self-pay

## 2020-10-11 ENCOUNTER — Ambulatory Visit (HOSPITAL_COMMUNITY)
Admission: RE | Admit: 2020-10-11 | Discharge: 2020-10-11 | Disposition: A | Discharge status: Home / Self Care | Payer: Medicare Other | Source: Ambulatory Visit | Attending: Internal Medicine | Admitting: Internal Medicine

## 2020-10-11 DIAGNOSIS — M545 Low back pain, unspecified: Secondary | ICD-10-CM | POA: Insufficient documentation

## 2020-10-21 DIAGNOSIS — G9519 Other vascular myelopathies: Secondary | ICD-10-CM | POA: Diagnosis not present

## 2020-10-21 DIAGNOSIS — E1169 Type 2 diabetes mellitus with other specified complication: Secondary | ICD-10-CM | POA: Diagnosis not present

## 2020-10-21 DIAGNOSIS — E1142 Type 2 diabetes mellitus with diabetic polyneuropathy: Secondary | ICD-10-CM | POA: Diagnosis not present

## 2020-10-21 DIAGNOSIS — I779 Disorder of arteries and arterioles, unspecified: Secondary | ICD-10-CM | POA: Diagnosis not present

## 2020-10-21 DIAGNOSIS — E1165 Type 2 diabetes mellitus with hyperglycemia: Secondary | ICD-10-CM | POA: Diagnosis not present

## 2020-10-21 DIAGNOSIS — I10 Essential (primary) hypertension: Secondary | ICD-10-CM | POA: Diagnosis not present

## 2020-10-21 DIAGNOSIS — Z299 Encounter for prophylactic measures, unspecified: Secondary | ICD-10-CM | POA: Diagnosis not present

## 2020-11-19 DIAGNOSIS — E1169 Type 2 diabetes mellitus with other specified complication: Secondary | ICD-10-CM | POA: Diagnosis not present

## 2020-12-21 DIAGNOSIS — E1169 Type 2 diabetes mellitus with other specified complication: Secondary | ICD-10-CM | POA: Diagnosis not present

## 2021-01-20 DIAGNOSIS — E1169 Type 2 diabetes mellitus with other specified complication: Secondary | ICD-10-CM | POA: Diagnosis not present

## 2021-02-02 DIAGNOSIS — E1165 Type 2 diabetes mellitus with hyperglycemia: Secondary | ICD-10-CM | POA: Diagnosis not present

## 2021-02-02 DIAGNOSIS — E7800 Pure hypercholesterolemia, unspecified: Secondary | ICD-10-CM | POA: Diagnosis not present

## 2021-02-02 DIAGNOSIS — Z299 Encounter for prophylactic measures, unspecified: Secondary | ICD-10-CM | POA: Diagnosis not present

## 2021-02-02 DIAGNOSIS — I10 Essential (primary) hypertension: Secondary | ICD-10-CM | POA: Diagnosis not present

## 2021-02-02 DIAGNOSIS — R200 Anesthesia of skin: Secondary | ICD-10-CM | POA: Diagnosis not present

## 2021-02-17 DIAGNOSIS — Z299 Encounter for prophylactic measures, unspecified: Secondary | ICD-10-CM | POA: Diagnosis not present

## 2021-02-17 DIAGNOSIS — Z0000 Encounter for general adult medical examination without abnormal findings: Secondary | ICD-10-CM | POA: Diagnosis not present

## 2021-02-17 DIAGNOSIS — Z79899 Other long term (current) drug therapy: Secondary | ICD-10-CM | POA: Diagnosis not present

## 2021-02-17 DIAGNOSIS — R5383 Other fatigue: Secondary | ICD-10-CM | POA: Diagnosis not present

## 2021-02-17 DIAGNOSIS — E7800 Pure hypercholesterolemia, unspecified: Secondary | ICD-10-CM | POA: Diagnosis not present

## 2021-02-17 DIAGNOSIS — I10 Essential (primary) hypertension: Secondary | ICD-10-CM | POA: Diagnosis not present

## 2021-02-17 DIAGNOSIS — Z6820 Body mass index (BMI) 20.0-20.9, adult: Secondary | ICD-10-CM | POA: Diagnosis not present

## 2021-02-17 DIAGNOSIS — Z1339 Encounter for screening examination for other mental health and behavioral disorders: Secondary | ICD-10-CM | POA: Diagnosis not present

## 2021-02-17 DIAGNOSIS — Z1331 Encounter for screening for depression: Secondary | ICD-10-CM | POA: Diagnosis not present

## 2021-02-17 DIAGNOSIS — Z7189 Other specified counseling: Secondary | ICD-10-CM | POA: Diagnosis not present

## 2021-02-18 DIAGNOSIS — E1169 Type 2 diabetes mellitus with other specified complication: Secondary | ICD-10-CM | POA: Diagnosis not present

## 2021-02-22 DIAGNOSIS — H26493 Other secondary cataract, bilateral: Secondary | ICD-10-CM | POA: Diagnosis not present

## 2021-02-22 DIAGNOSIS — Z961 Presence of intraocular lens: Secondary | ICD-10-CM | POA: Diagnosis not present

## 2021-02-22 DIAGNOSIS — E113293 Type 2 diabetes mellitus with mild nonproliferative diabetic retinopathy without macular edema, bilateral: Secondary | ICD-10-CM | POA: Diagnosis not present

## 2021-02-22 DIAGNOSIS — H18513 Endothelial corneal dystrophy, bilateral: Secondary | ICD-10-CM | POA: Diagnosis not present

## 2021-02-22 DIAGNOSIS — H31092 Other chorioretinal scars, left eye: Secondary | ICD-10-CM | POA: Diagnosis not present

## 2021-02-22 DIAGNOSIS — H3581 Retinal edema: Secondary | ICD-10-CM | POA: Diagnosis not present

## 2021-03-21 DIAGNOSIS — I779 Disorder of arteries and arterioles, unspecified: Secondary | ICD-10-CM | POA: Diagnosis not present

## 2021-03-21 DIAGNOSIS — J069 Acute upper respiratory infection, unspecified: Secondary | ICD-10-CM | POA: Diagnosis not present

## 2021-03-21 DIAGNOSIS — Z299 Encounter for prophylactic measures, unspecified: Secondary | ICD-10-CM | POA: Diagnosis not present

## 2021-03-21 DIAGNOSIS — E1165 Type 2 diabetes mellitus with hyperglycemia: Secondary | ICD-10-CM | POA: Diagnosis not present

## 2021-03-21 DIAGNOSIS — E1140 Type 2 diabetes mellitus with diabetic neuropathy, unspecified: Secondary | ICD-10-CM | POA: Diagnosis not present

## 2021-03-23 DIAGNOSIS — E1169 Type 2 diabetes mellitus with other specified complication: Secondary | ICD-10-CM | POA: Diagnosis not present

## 2021-03-30 ENCOUNTER — Ambulatory Visit: Payer: Medicare Other | Admitting: Cardiology

## 2021-04-22 DIAGNOSIS — E1169 Type 2 diabetes mellitus with other specified complication: Secondary | ICD-10-CM | POA: Diagnosis not present

## 2021-04-26 ENCOUNTER — Ambulatory Visit: Payer: Medicare Other | Admitting: Cardiology

## 2021-05-11 DIAGNOSIS — I10 Essential (primary) hypertension: Secondary | ICD-10-CM | POA: Diagnosis not present

## 2021-05-11 DIAGNOSIS — Z6820 Body mass index (BMI) 20.0-20.9, adult: Secondary | ICD-10-CM | POA: Diagnosis not present

## 2021-05-11 DIAGNOSIS — E1165 Type 2 diabetes mellitus with hyperglycemia: Secondary | ICD-10-CM | POA: Diagnosis not present

## 2021-05-11 DIAGNOSIS — Z299 Encounter for prophylactic measures, unspecified: Secondary | ICD-10-CM | POA: Diagnosis not present

## 2021-05-11 DIAGNOSIS — M25521 Pain in right elbow: Secondary | ICD-10-CM | POA: Diagnosis not present

## 2021-05-23 DIAGNOSIS — E1169 Type 2 diabetes mellitus with other specified complication: Secondary | ICD-10-CM | POA: Diagnosis not present

## 2021-05-26 DIAGNOSIS — Z23 Encounter for immunization: Secondary | ICD-10-CM | POA: Diagnosis not present

## 2021-06-03 DIAGNOSIS — Z23 Encounter for immunization: Secondary | ICD-10-CM | POA: Diagnosis not present

## 2021-06-06 DIAGNOSIS — M7711 Lateral epicondylitis, right elbow: Secondary | ICD-10-CM | POA: Diagnosis not present

## 2021-06-06 DIAGNOSIS — I10 Essential (primary) hypertension: Secondary | ICD-10-CM | POA: Diagnosis not present

## 2021-06-06 DIAGNOSIS — R252 Cramp and spasm: Secondary | ICD-10-CM | POA: Diagnosis not present

## 2021-06-06 DIAGNOSIS — Z6820 Body mass index (BMI) 20.0-20.9, adult: Secondary | ICD-10-CM | POA: Diagnosis not present

## 2021-06-06 DIAGNOSIS — Z299 Encounter for prophylactic measures, unspecified: Secondary | ICD-10-CM | POA: Diagnosis not present

## 2021-06-15 ENCOUNTER — Other Ambulatory Visit: Payer: Self-pay

## 2021-06-15 ENCOUNTER — Encounter: Payer: Self-pay | Admitting: Cardiology

## 2021-06-15 ENCOUNTER — Ambulatory Visit: Payer: Medicare Other | Admitting: Cardiology

## 2021-06-15 VITALS — BP 178/69 | HR 73 | Temp 98.3°F | Resp 16 | Ht <= 58 in | Wt 111.4 lb

## 2021-06-15 DIAGNOSIS — I10 Essential (primary) hypertension: Secondary | ICD-10-CM | POA: Diagnosis not present

## 2021-06-15 DIAGNOSIS — R0989 Other specified symptoms and signs involving the circulatory and respiratory systems: Secondary | ICD-10-CM | POA: Diagnosis not present

## 2021-06-15 DIAGNOSIS — I739 Peripheral vascular disease, unspecified: Secondary | ICD-10-CM

## 2021-06-15 DIAGNOSIS — E782 Mixed hyperlipidemia: Secondary | ICD-10-CM | POA: Diagnosis not present

## 2021-06-15 MED ORDER — ROSUVASTATIN CALCIUM 20 MG PO TABS: 20.0000 mg | ORAL_TABLET | Freq: Every day | ORAL | 3 refills | Status: DC

## 2021-06-15 MED ORDER — BISOPROLOL FUMARATE 10 MG PO TABS: 10.0000 mg | ORAL_TABLET | Freq: Every day | ORAL | 2 refills | Status: DC

## 2021-06-15 NOTE — Progress Notes (Signed)
Primary Physician/Referring:  Ignatius Specking, MD  Patient ID: Brianna Hendricks, female    DOB: Jul 21, 1941, 80 y.o.   MRN: [id number]  Chief Complaint  Patient presents with   PAD   Follow-up    6 months   HPI:    HPI: Brianna Hendricks  is a 80 y.o. sian [place] female with history of chronic ILBBB, hypertension, hyperlipidemia, diabetes mellitus and PAD S/P left iliac artery stent in 2019 presents for follow-up of claudication.    She has also noticed tingling and numbness in hands and feet over and this is chronic. Remains active at home and is able to do all household chores without any leg cramps, dyspnea or chest pain.   She did not bring her medications today.  Past Medical History:  Diagnosis Date   Arthritis    "some in my" 4th digit right hand  (12/25/2017)   High cholesterol    History of blood transfusion    "related to female bleeding"   Hypertension    PAD (peripheral artery disease) (HCC)    Stroke (HCC) 1998   "fully recovered" (12/25/2017)   Type II diabetes mellitus (HCC)    Past Surgical History:  Procedure Laterality Date   ABDOMINAL AORTOGRAM N/A 12/25/2017   Procedure: ABDOMINAL AORTOGRAM;  Surgeon: Yates Decamp, MD;  Location: [facility];  Service: Cardiovascular;  Laterality: N/A;   CATARACT EXTRACTION W/ INTRAOCULAR LENS  IMPLANT, BILATERAL Bilateral    CHOLECYSTECTOMY     LOWER EXTREMITY ANGIOGRAPHY Bilateral 12/25/2017   Procedure: LOWER EXTREMITY ANGIOGRAPHY;  Surgeon: Yates Decamp, MD;  Location: [facility];  Service: Cardiovascular;  Laterality: Bilateral;   PERIPHERAL VASCULAR ATHERECTOMY Left 12/25/2017   Procedure: PERIPHERAL VASCULAR ATHERECTOMY;  Surgeon: Yates Decamp, MD;  Location: [facility];  Service: Cardiovascular;  Laterality: Left;  common iliac   PERIPHERAL VASCULAR INTERVENTION Left 12/25/2017   Procedure: PERIPHERAL VASCULAR INTERVENTION;  Surgeon: Yates Decamp, MD;  Location: [facility];   Service: Cardiovascular;  Laterality: Left;  common iliac   THYROID SURGERY  1972   " not sure what was done:"   TONSILLECTOMY     Social History   Tobacco Use   Smoking status: Never   Smokeless tobacco: Never  Substance Use Topics   Alcohol use: Never  Marital Status: Married    ROS  Review of Systems  Cardiovascular:  Positive for claudication (left leg ). Negative for chest pain, dyspnea on exertion and leg swelling.  Musculoskeletal:  Positive for arthritis.  Gastrointestinal:  Negative for melena.  Neurological:  Positive for paresthesias (feet and hands).  Objective  Blood pressure (!) 178/69, pulse 73, temperature 98.3 F (36.8 C), temperature source Temporal, resp. rate 16, height 4\' 10"  (1.473 m), weight 111 lb 6.4 oz (50.5 kg), SpO2 100 %. Body mass index is 23.28 kg/m.   Vitals with BMI 06/15/2021 06/15/2021 09/22/2020  Height - 4\' 10"  4\' 10"   Weight - 111 lbs 6 oz 112 lbs 10 oz  BMI - 23.29 23.54  Systolic 178 152 412  Diastolic 69 64 68  Pulse 73 67 82      Physical Exam Neck:     Vascular: Carotid bruit (Bilateral) present. No JVD.  Cardiovascular:     Rate and Rhythm: Normal rate and regular rhythm.     Pulses: Intact distal pulses.          Femoral pulses are 2+ on the right side and 2+ on  the left side with bruit.      Dorsalis pedis pulses are 2+ on the right side and 2+ on the left side.       Posterior tibial pulses are 1+ on the right side and 1+ on the left side.     Heart sounds: Normal heart sounds. No murmur heard.   No gallop.  Pulmonary:     Effort: Pulmonary effort is normal.     Breath sounds: Normal breath sounds.  Abdominal:     General: Bowel sounds are normal. There is abdominal bruit.     Palpations: Abdomen is soft.  Musculoskeletal:     Right lower leg: No edema.     Left lower leg: No edema.  Skin:    General: Skin is warm.     Capillary Refill: Capillary refill takes less than 2 seconds.   Radiology: No results  found.  Laboratory examination:   Cholesterol, total 220.000 M 02/17/2021 HDL 61.000 MG 02/17/2021 LDL 114 Triglycerides 166.000 M 02/17/2021  Hemoglobin 10.500 G/ 02/17/2021 Platelets 195.000 X1 02/17/2021  Creatinine, Serum 0.870 MG/ 02/17/2021 Potassium 4.000 mm 12/25/2017 ALT (SGPT) 23.000 IU/ 02/17/2021  TSH 3.690 02/17/2021   Cholesterol, total 195.000 M 02/16/2020 HDL 57.000 MG 02/16/2020 LDL 90.8 Triglycerides 236.000 M 02/16/2020   Medications   Current Outpatient Medications on File Prior to Visit  Medication Sig Dispense Refill   amLODipine (NORVASC) 10 MG tablet Take 1 tablet (10 mg total) by mouth daily. 90 tablet 3   aspirin EC 81 MG tablet Take 81 mg by mouth daily.     Calcium Carbonate-Vitamin D 600-400 MG-UNIT tablet Take 1 tablet by mouth 2 (two) times daily.     carbamazepine (TEGRETOL) 200 MG tablet Take 200 mg by mouth 2 (two) times daily.     folic acid (FOLVITE) 1 MG tablet Take 1 mg by mouth every evening.     glipiZIDE (GLUCOTROL) 5 MG tablet Take by mouth 2 (two) times daily.     losartan (COZAAR) 100 MG tablet Take 50 mg by mouth daily.     metFORMIN (GLUCOPHAGE) 500 MG tablet Take 500 mg by mouth 2 (two) times daily with a meal.     pregabalin (LYRICA) 75 MG capsule Take 75 mg by mouth daily.      No current facility-administered medications on file prior to visit.     Cardiac Studies:   Echo- 04/24/13 1.Left ventricular cavity is normal in size.  Abnormal septal motion due to IVCD.  Lower limits systolic global function.   Calculated EF 55%.   Visual EF is 50-55%.   Doppler evidence of Grade I (impaired) diastolic dysfunction. 2.Left atrial cavity is slightly dilated. 3.Mild calcification of the mitral annulus.   Trace mitral regurgitation.   Mitral valve inflow A > E ratio. 4.Tricuspid valve structurally normal.   Mild tricuspid regurgitation.  Lexiscan Myoview stress test 04/25/2013: 1. Resting EKG NSR, LBBB. Stress EKG was non diagnostic for  ischemia. No ST-T changes of ischemia noted with pharmacologic stress testing. Stress symptoms included chest pressure, sob, stomach discomfort and headache. Stress terminated due to completion of protocol. 2. The perfusion study demonstrated mild breast attenuation artifact in the anterior wall. There was no evidence of ischemia or scar. Dynamic gated images reveal normal wall motion and endocardial thickening. Left ventricular ejection fraction was estimated to be 69%.  This represents a low risk study.  Carotid artery duplex 12/21/2017: No significant stenosis right ICA. Stenosis in the left external carotid artery (>  50%) with heterogenous plaque. Antegrade right vertebral artery flow. Antegrade left vertebral artery flow. Follow up in one year or if clinically indicated. No significant change from 05/01/2013.  Peripheral arteriogram 12/25/2017: Left common iliac artery tandem 99% calcific stenosis S/P 2.0 solid crown CSI orbital atherectomy and Viabahn. Left AT appears occluded distally. Otherwise mild disease bilaterally.  ABI 01/23/2018: Moderately abnormal waveforms of the right ankle. Mildly abnormal waveforms of the left ankle. Mildly decreased right resting ABI. Mildly decreased left resting ABI. Compared to ABI 11/19/2017, normal ABI right, left was 0.7.  Abdominal Aortic Duplex 09/16/2020: The maximum aorta  diameter is 1 cm (prox). Severe plaque observed in the proximal, mid and distal aorta. Severe plaque observed in the right internal iliac and left internal iliac arteries.  Velocity is moderately elevated throughout the abdominal aorta and iliac vessels. No focal stenosis identified.  Clinical correlation recommended. Consider catheter directed angiography to evaluate the abdominal aortic stenosis.  Lower Extremity Arterial Duplex 09/16/2020: No hemodynamically significant stenoses are identified in the bilateral lower extremity arterial system.   This exam reveals moderately  decreased perfusion of the right lower extremity, noted at the anterior tibial and post tibial artery level (ABI 0.68) and moderately decreased perfusion of the left lower extremity, noted at the anterior tibial and post tibial artery level (ABI 0.65).  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  EKG:  EKG 06/15/2021: Normal sinus rhythm at rate of 66 bpm, left axis deviation, left anterior fascicular block.  Poor R wave progression, cannot exclude anteroseptal infarct old.  IVCD, incomplete left bundle branch block, borderline criteria for LVH.  No significant change from prior EKG.  Assessment     ICD-10-CM   1. PAD (peripheral artery disease) (HCC)  I73.9 EKG 12-Lead    2. Mixed hyperlipidemia  E78.2 rosuvastatin (CRESTOR) 20 MG tablet    Lipid Panel With LDL/HDL Ratio    Lipid Panel With LDL/HDL Ratio    3. Primary hypertension  I10 bisoprolol (ZEBETA) 10 MG tablet    4. Bilateral carotid bruits  R09.89 PCV CAROTID DUPLEX (BILATERAL)     Meds ordered this encounter  Medications   bisoprolol (ZEBETA) 10 MG tablet    Sig: Take 1 tablet (10 mg total) by mouth daily.    Dispense:  30 tablet    Refill:  2   rosuvastatin (CRESTOR) 20 MG tablet    Sig: Take 1 tablet (20 mg total) by mouth at bedtime.    Dispense:  90 tablet    Refill:  3   Medications Discontinued During This Encounter  Medication Reason   rosuvastatin (CRESTOR) 5 MG tablet Reorder    Orders Placed This Encounter  Procedures   Lipid Panel With LDL/HDL Ratio    Standing Status:   Future    Number of Occurrences:   1    Standing Expiration Date:   06/15/2022   EKG 12-Lead     Recommendations:   Mrs. Margeret Stachnik is a 80 y.o. [place] female with history of chronic ILBBB, hypertension, hyperlipidemia, diabetes mellitus and PAD S/P left iliac artery stent in 2019 presents for follow-up of claudication.    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  No change in physical exam with regard to vascular examination in the lower extremity.  By angiography, in 2019, she had an occluded bilateral AT but otherwise very mild disease in the  lower extremity.  Reviewed her external labs, I am very concerned about her elevated LDL.  We will increase her Crestor from 5 mg to 20 mg daily, will obtain lipid profile testing in 1 month and have like to see her back in 6 weeks.  Blood pressure is not well controlled, I am adding bisoprolol 10 mg daily.  I will follow-up on this.  She also has bilateral very prominent bruit, will repeat carotid artery duplex that was done >3 years ago in view of significant cardiovascular risk especially diabetes mellitus.  If blood pressure cannot be controlled with the changes in the medication, will also consider renal artery duplex as she has prominent abdominal bruit.  She has severe atherosclerotic burden in the abdominal aorta as well.  She is not sure of her medication list.  I tried to reconcile her medications but it is extremely difficult as she also obtains some of the medications from [place].  I will request her son to send me a message on my phone as I know him personally and he is a [employer] worker.   Yates Decamp, MD, [facility] 06/15/2021, 2:07 PM Office: [phone] Pager: [phone]

## 2021-06-21 ENCOUNTER — Ambulatory Visit: Payer: Medicare Other

## 2021-06-21 ENCOUNTER — Other Ambulatory Visit: Payer: Self-pay

## 2021-06-21 DIAGNOSIS — I6523 Occlusion and stenosis of bilateral carotid arteries: Secondary | ICD-10-CM | POA: Diagnosis not present

## 2021-06-21 DIAGNOSIS — R0989 Other specified symptoms and signs involving the circulatory and respiratory systems: Secondary | ICD-10-CM

## 2021-06-22 DIAGNOSIS — E1169 Type 2 diabetes mellitus with other specified complication: Secondary | ICD-10-CM | POA: Diagnosis not present

## 2021-06-26 NOTE — Progress Notes (Signed)
Mild disease on the left and I will do repeat study in one year

## 2021-06-30 NOTE — Progress Notes (Signed)
Called and spoke with patient regarding her CAD results.

## 2021-07-21 IMAGING — MR MR LUMBAR SPINE W/O CM
4 of 5 series · 27 of 48 positions shown · non-contrast
Comparison: 05/20/2013 report.

CLINICAL DATA: Left hip and back pain.

EXAM:
MRI LUMBAR SPINE WITHOUT CONTRAST
TECHNIQUE: Multiplanar, multisequence MR imaging of the lumbar spine was
performed. No intravenous contrast was administered.

[Series 5: T2 · sagittal · 4.0mm · 0.71mm/px · 6 of 15 slices shown (1 of 2)]
[im 1/15]
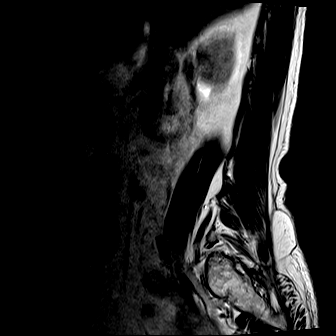
[im 3/15]
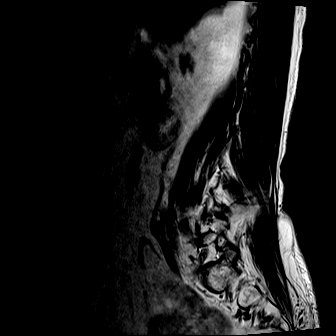
[im 6/15]
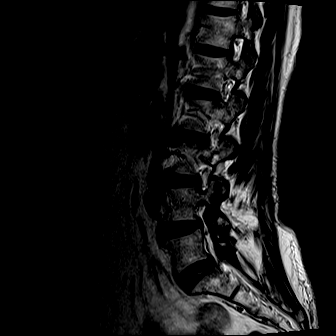
[im 9/15]
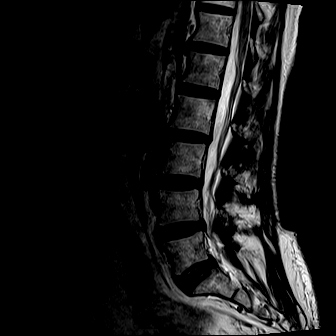
[im 12/15]
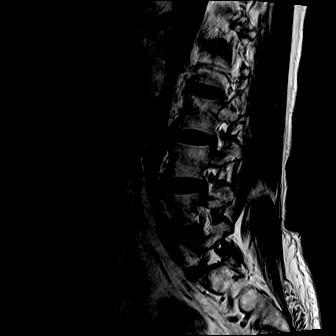
[im 15/15]
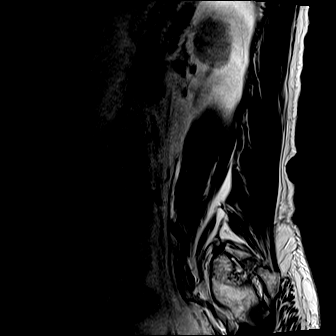

[Series 7: T1 · sagittal · 4.0mm · 0.83mm/px · 7 of 15 slices shown (1 of 2)]
[im 1/15]
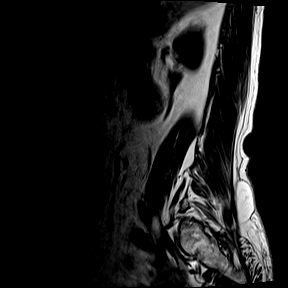
[im 3/15]
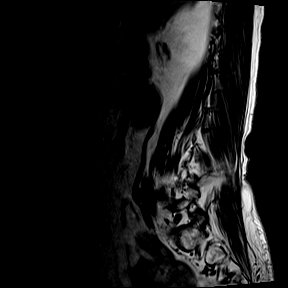
[im 5/15]
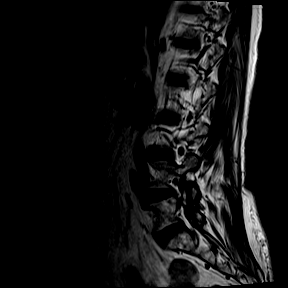
[im 8/15]
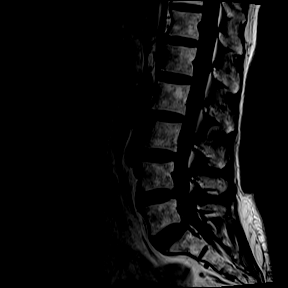
[im 10/15]
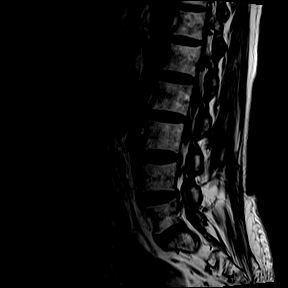
[im 12/15]
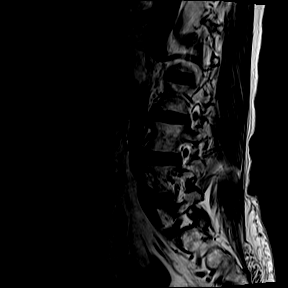
[im 15/15]
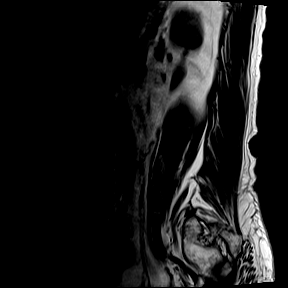

[Series 8: T2 · axial · 4.0mm · 0.57mm/px · z∈[-96,+86]mm · 8 of 29 slices shown (2 of 2)]
[im 1/29]
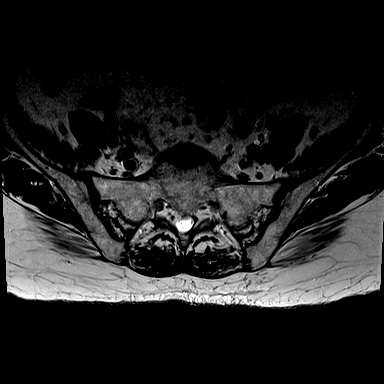
[im 5/29]
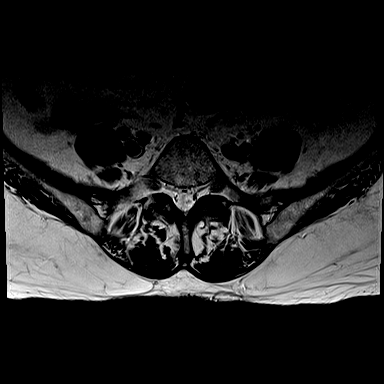
[im 9/29]
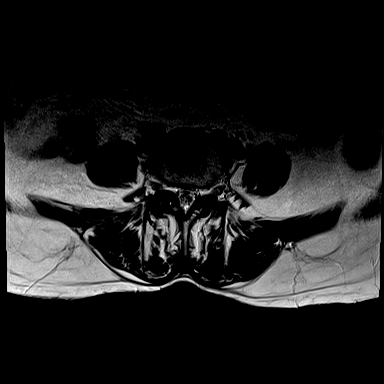
[im 13/29]
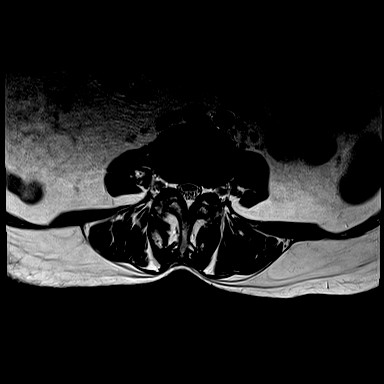
[im 16/29]
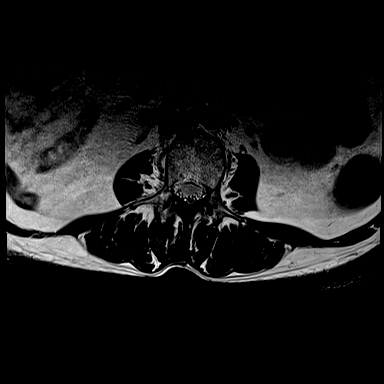
[im 20/29]
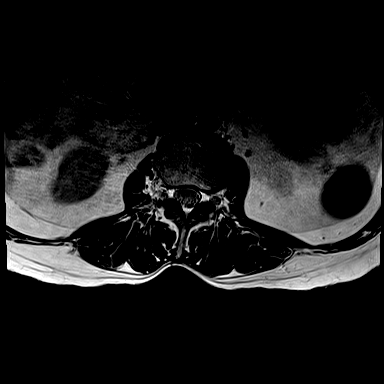
[im 24/29]
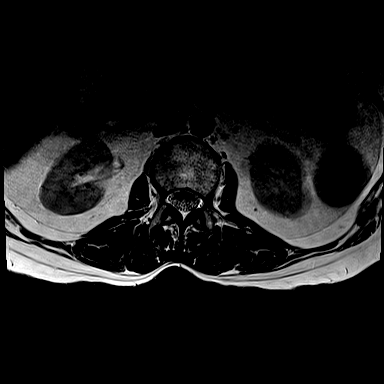
[im 29/29]
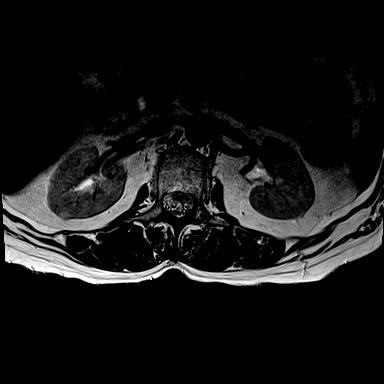

[Series 9: T1 · axial · 4.0mm · 0.34mm/px · z∈[-96,+61]mm · 6 of 29 slices shown (2 of 2)]
[im 1/29]
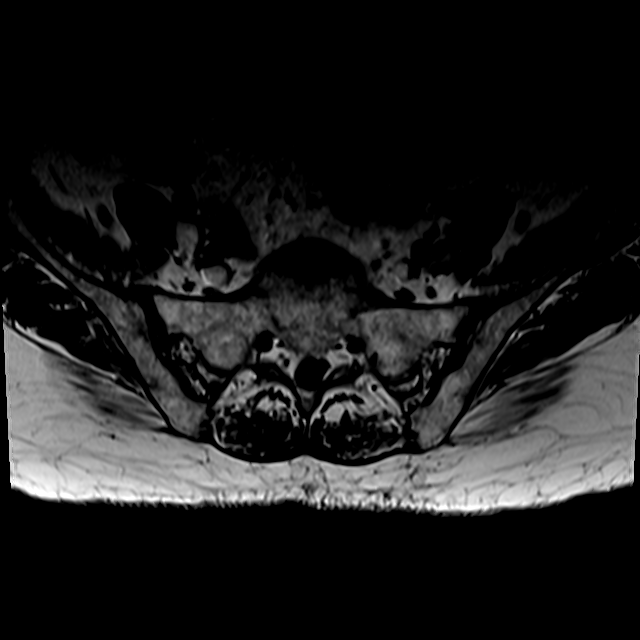
[im 5/29]
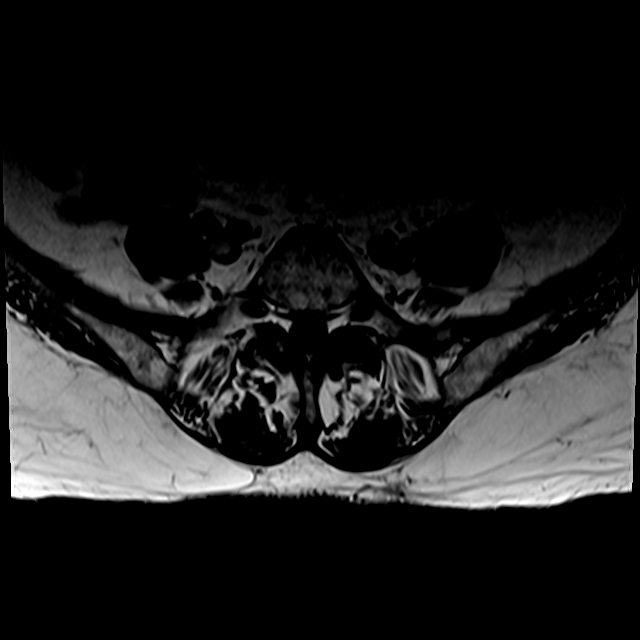
[im 9/29]
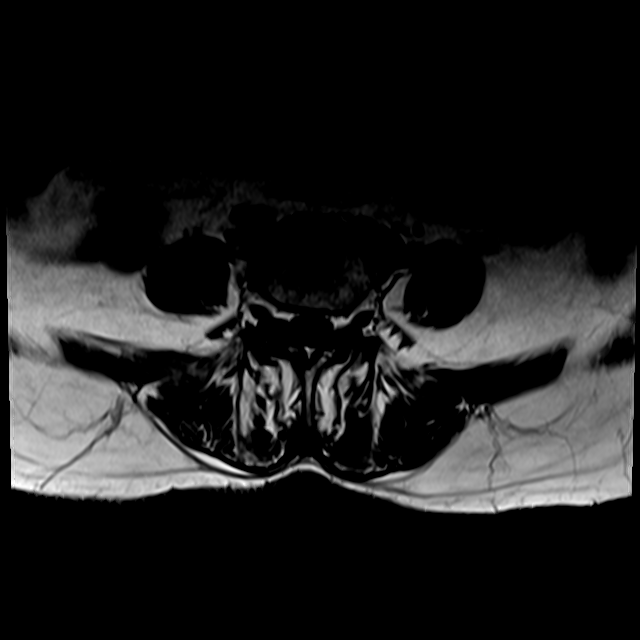
[im 13/29]
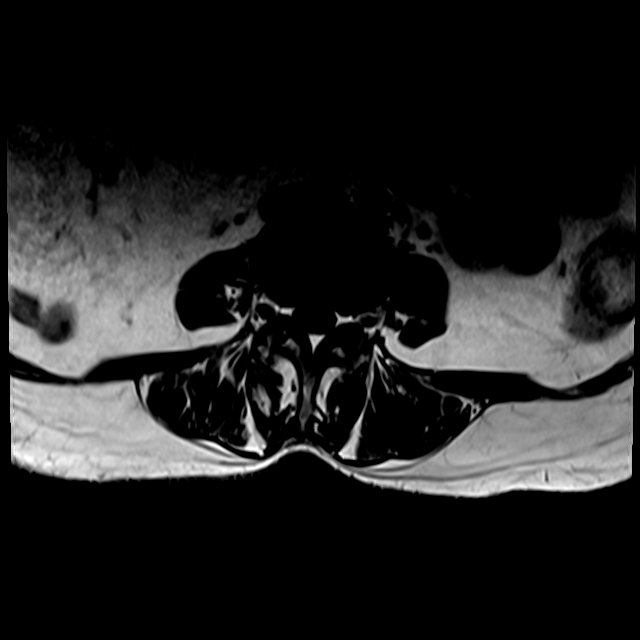
[im 16/29]
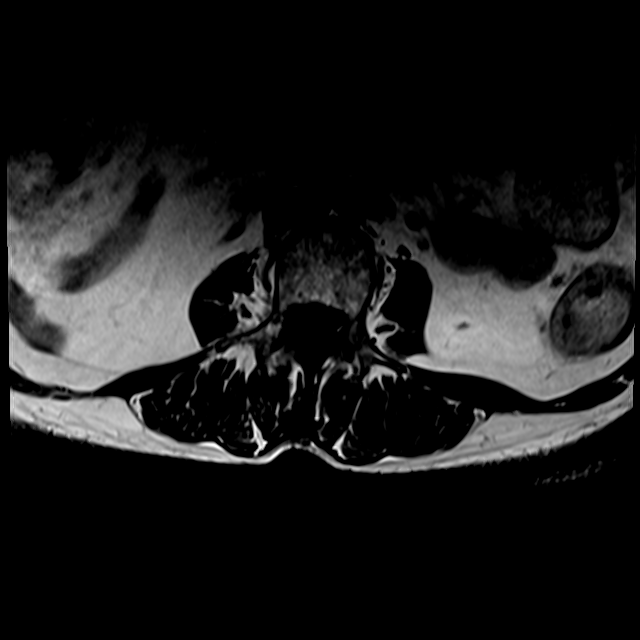
[im 24/29]
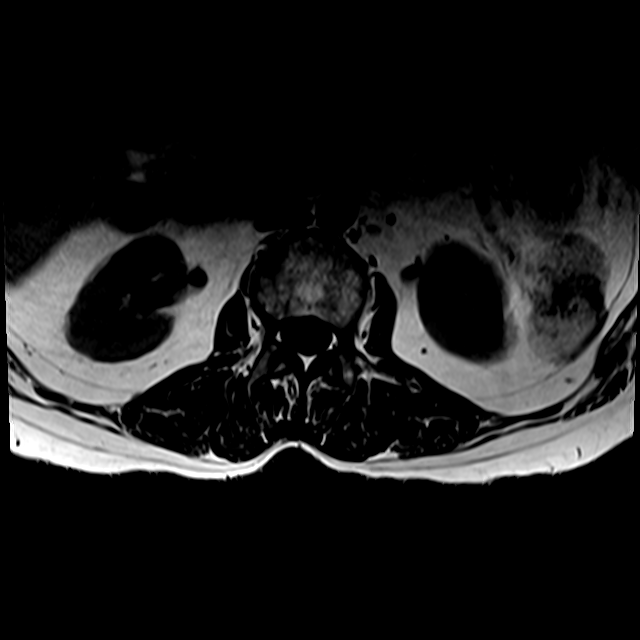

[27 of 48 positions shown; findings below may reference images not displayed]

FINDINGS: Segmentation:  Standard.

Alignment:  Normal.

Vertebrae: Normal bone marrow signal intensity. No fracture or
aggressive osseous lesion.

Conus medullaris and cauda equina: Conus extends to the L1 level.
Conus and cauda equina appear normal.

Disc levels: Multilevel desiccation.

L1-2: No significant disc bulge. Patent spinal canal and neural
foramen.

L2-3: Minimal disc bulge with shallow foraminal protrusions. Patent
spinal canal and neural foramen.

L3-4: Minimal disc bulge and facet degenerative spurring. Mild
spinal canal and bilateral neural foraminal narrowing.

L4-5: Mild disc bulge, ligamentum flavum thickening and bilateral
facet hypertrophy. Mild spinal canal and bilateral neural foraminal
narrowing.

L5-S1: Mild disc bulge and facet degenerative spurring. Patent
spinal canal and neural foramen.

Paraspinal and other soft tissues: Negative.
IMPRESSION: Mild spinal canal and bilateral neural foraminal narrowing at the
L3-4, L4-5 levels.

## 2021-07-22 DIAGNOSIS — E1169 Type 2 diabetes mellitus with other specified complication: Secondary | ICD-10-CM | POA: Diagnosis not present

## 2021-07-26 DIAGNOSIS — E782 Mixed hyperlipidemia: Secondary | ICD-10-CM | POA: Diagnosis not present

## 2021-07-27 ENCOUNTER — Ambulatory Visit: Payer: Medicare Other | Admitting: Cardiology

## 2021-07-27 ENCOUNTER — Encounter: Payer: Self-pay | Admitting: Cardiology

## 2021-07-27 ENCOUNTER — Other Ambulatory Visit: Payer: Self-pay

## 2021-07-27 VITALS — BP 162/80 | HR 64 | Temp 97.3°F | Resp 16 | Ht <= 58 in | Wt 109.4 lb

## 2021-07-27 DIAGNOSIS — I6522 Occlusion and stenosis of left carotid artery: Secondary | ICD-10-CM

## 2021-07-27 DIAGNOSIS — I10 Essential (primary) hypertension: Secondary | ICD-10-CM

## 2021-07-27 DIAGNOSIS — E782 Mixed hyperlipidemia: Secondary | ICD-10-CM

## 2021-07-27 LAB — LIPID PANEL WITH LDL/HDL RATIO
Cholesterol, Total: 101 mg/dL (ref 100–199)
HDL: 47 mg/dL (ref >39)
LDL Chol Calc (NIH): 34 mg/dL (ref 0–99)
LDL/HDL Ratio: 0.7 ratio (ref 0.0–3.2)
Triglycerides: 112 mg/dL (ref 0–149)
VLDL Cholesterol Cal: 20 mg/dL (ref 5–40)

## 2021-07-27 MED ORDER — AMLODIPINE BESYLATE 10 MG PO TABS: 10.0000 mg | ORAL_TABLET | Freq: Every day | ORAL | 3 refills | Status: DC

## 2021-07-27 NOTE — Progress Notes (Signed)
Primary Physician/Referring:  Glenda Chroman, MD  Patient ID: Brianna Hendricks, female    DOB: 1940/08/01, 81 y.o.   MRN: [id number]  Chief Complaint  Patient presents with   PAD   Follow-up   HPI:    HPI: Brianna Hendricks  is a 81 y.o. [place] female with history of chronic ILBBB, hypertension, hyperlipidemia, diabetes mellitus and PAD S/P left iliac artery stent in 2019.   Patient was seen 06/15/2021,  LDL is elevated, therefore increase Crestor from 5 mg to 20 mg daily, blood pressure was uncontrolled, therefore added bisoprolol 10 mg daily.  She is tolerating medication changes.  She now presents for follow-up.  She underwent carotid artery duplex and lipid profile testing.  She has also noticed tingling and numbness in hands and feet over and this is chronic. Remains active at home and is able to do all household chores without any leg cramps, dyspnea or chest pain.     Past Medical History:  Diagnosis Date   Arthritis    "some in my" 4th digit right hand  (12/25/2017)   High cholesterol    History of blood transfusion    "related to female bleeding"   Hypertension    PAD (peripheral artery disease) ([place])    Stroke ([place]) 1998   "fully recovered" (12/25/2017)   Type II diabetes mellitus ([place])    Past Surgical History:  Procedure Laterality Date   ABDOMINAL AORTOGRAM N/A 12/25/2017   Procedure: ABDOMINAL AORTOGRAM;  Surgeon: Adrian Prows, MD;  Location: [facility];  Service: Cardiovascular;  Laterality: N/A;   CATARACT EXTRACTION W/ INTRAOCULAR LENS  IMPLANT, BILATERAL Bilateral    CHOLECYSTECTOMY     LOWER EXTREMITY ANGIOGRAPHY Bilateral 12/25/2017   Procedure: LOWER EXTREMITY ANGIOGRAPHY;  Surgeon: Adrian Prows, MD;  Location: [facility];  Service: Cardiovascular;  Laterality: Bilateral;   PERIPHERAL VASCULAR ATHERECTOMY Left 12/25/2017   Procedure: PERIPHERAL VASCULAR ATHERECTOMY;  Surgeon: Adrian Prows, MD;  Location: [facility];   Service: Cardiovascular;  Laterality: Left;  common iliac   PERIPHERAL VASCULAR INTERVENTION Left 12/25/2017   Procedure: PERIPHERAL VASCULAR INTERVENTION;  Surgeon: Adrian Prows, MD;  Location: [facility];  Service: Cardiovascular;  Laterality: Left;  common iliac   THYROID SURGERY  1972   " not sure what was done:"   TONSILLECTOMY     Family History  Problem Relation Age of Onset   Heart attack Father    Social History   Tobacco Use   Smoking status: Never   Smokeless tobacco: Never  Substance Use Topics   Alcohol use: Never  Marital Status: Married    ROS  Review of Systems  Cardiovascular:  Positive for claudication (left leg ). Negative for chest pain, dyspnea on exertion and leg swelling.  Musculoskeletal:  Positive for arthritis.  Gastrointestinal:  Negative for melena.  Neurological:  Positive for paresthesias (feet and hands).  Objective  Blood pressure (!) 162/80, pulse 64, temperature (!) 97.3 F (36.3 C), temperature source Temporal, resp. rate 16, height 4\' 10"  (1.473 m), weight 109 lb 6.4 oz (49.6 kg), SpO2 100 %. Body mass index is 22.86 kg/m.   Vitals with BMI 07/27/2021 07/27/2021 06/15/2021  Height - 4\' 10"  -  Weight - 109 lbs 6 oz -  BMI - 0000000 -  Systolic 0000000 [id number] 0000000  Diastolic 80 50 69  Pulse - 64 73      Physical Exam Neck:     Vascular: Carotid  bruit (Bilateral) present. No JVD.  Cardiovascular:     Rate and Rhythm: Normal rate and regular rhythm.     Pulses: Intact distal pulses.          Femoral pulses are 2+ on the right side and 2+ on the left side with bruit.      Dorsalis pedis pulses are 2+ on the right side and 2+ on the left side.       Posterior tibial pulses are 1+ on the right side and 1+ on the left side.     Heart sounds: Normal heart sounds. No murmur heard.   No gallop.  Pulmonary:     Effort: Pulmonary effort is normal.     Breath sounds: Normal breath sounds.  Abdominal:     General: Bowel sounds are normal. There is  abdominal bruit.     Palpations: Abdomen is soft.  Musculoskeletal:     Right lower leg: No edema.     Left lower leg: No edema.  Skin:    General: Skin is warm.     Capillary Refill: Capillary refill takes less than 2 seconds.   Laboratory examination:    Lipid Panel     Component Value Date/Time   CHOL 101 07/26/2021 0831   TRIG 112 07/26/2021 0831   HDL 47 07/26/2021 0831   LDLCALC 34 07/26/2021 0831   HEMOGLOBIN A1C No results found for: HGBA1C, MPG TSH No results for input(s): TSH in the last 8760 hours.  External Labs: Cholesterol, total 220.000 M 02/17/2021 HDL 61.000 MG 02/17/2021 LDL 114 Triglycerides 166.000 M 02/17/2021  Hemoglobin 10.500 G/ 02/17/2021 Platelets 195.000 X1 02/17/2021  Creatinine, Serum 0.870 MG/ 02/17/2021 Potassium 4.000 mm 12/25/2017 ALT (SGPT) 23.000 IU/ 02/17/2021  TSH 3.690 02/17/2021   Cholesterol, total 195.000 M 02/16/2020 HDL 57.000 MG 02/16/2020 LDL 90.8 Triglycerides 236.000 M 02/16/2020  Allergies  No Known Allergies    Medications Prior to Visit:   Outpatient Medications Prior to Visit  Medication Sig Dispense Refill   aspirin EC 81 MG tablet Take 81 mg by mouth daily.     baclofen (LIORESAL) 10 MG tablet Take 10 mg by mouth daily.     bisoprolol (ZEBETA) 10 MG tablet Take 1 tablet (10 mg total) by mouth daily. 30 tablet 2   carbamazepine (TEGRETOL) 200 MG tablet Take 200 mg by mouth 2 (two) times daily.     folic acid (FOLVITE) 1 MG tablet Take 1 mg by mouth every evening.     glipiZIDE (GLUCOTROL) 5 MG tablet Take by mouth 2 (two) times daily.     losartan (COZAAR) 100 MG tablet Take 50 mg by mouth daily.     metFORMIN (GLUCOPHAGE) 500 MG tablet Take 500 mg by mouth 2 (two) times daily with a meal.     pregabalin (LYRICA) 75 MG capsule Take 75 mg by mouth daily.      rosuvastatin (CRESTOR) 20 MG tablet Take 1 tablet (20 mg total) by mouth at bedtime. 90 tablet 3   amLODipine (NORVASC) 10 MG tablet Take 1 tablet (10 mg  total) by mouth daily. (Patient taking differently: Take 5 mg by mouth daily.) 90 tablet 3   Calcium Carbonate-Vitamin D 600-400 MG-UNIT tablet Take 1 tablet by mouth 2 (two) times daily.     No facility-administered medications prior to visit.   Final Medications at End of Visit    Current Meds  Medication Sig   aspirin EC 81 MG tablet Take 81 mg by mouth  daily.   baclofen (LIORESAL) 10 MG tablet Take 10 mg by mouth daily.   bisoprolol (ZEBETA) 10 MG tablet Take 1 tablet (10 mg total) by mouth daily.   carbamazepine (TEGRETOL) 200 MG tablet Take 200 mg by mouth 2 (two) times daily.   folic acid (FOLVITE) 1 MG tablet Take 1 mg by mouth every evening.   glipiZIDE (GLUCOTROL) 5 MG tablet Take by mouth 2 (two) times daily.   losartan (COZAAR) 100 MG tablet Take 50 mg by mouth daily.   metFORMIN (GLUCOPHAGE) 500 MG tablet Take 500 mg by mouth 2 (two) times daily with a meal.   pregabalin (LYRICA) 75 MG capsule Take 75 mg by mouth daily.    rosuvastatin (CRESTOR) 20 MG tablet Take 1 tablet (20 mg total) by mouth at bedtime.   [DISCONTINUED] amLODipine (NORVASC) 10 MG tablet Take 1 tablet (10 mg total) by mouth daily. (Patient taking differently: Take 5 mg by mouth daily.)   Radiology:  No results found.  Cardiac Studies:   Echo- 04/24/13 1.Left ventricular cavity is normal in size.  Abnormal septal motion due to IVCD.  Lower limits systolic global function.   Calculated EF 55%.   Visual EF is 50-55%.   Doppler evidence of Grade I (impaired) diastolic dysfunction. 2.Left atrial cavity is slightly dilated. 3.Mild calcification of the mitral annulus.   Trace mitral regurgitation.   Mitral valve inflow A > E ratio. 4.Tricuspid valve structurally normal.   Mild tricuspid regurgitation.  Lexiscan Myoview stress test 04/25/2013: 1. Resting EKG NSR, LBBB. Stress EKG was non diagnostic for ischemia. No ST-T changes of ischemia noted with pharmacologic stress testing. Stress symptoms included chest  pressure, sob, stomach discomfort and headache. Stress terminated due to completion of protocol. 2. The perfusion study demonstrated mild breast attenuation artifact in the anterior wall. There was no evidence of ischemia or scar. Dynamic gated images reveal normal wall motion and endocardial thickening. Left ventricular ejection fraction was estimated to be 69%.  This represents a low risk study.  Peripheral arteriogram 12/25/2017: Left common iliac artery tandem 99% calcific stenosis S/P 2.0 solid crown CSI orbital atherectomy and Viabahn. Left AT appears occluded distally. Otherwise mild disease bilaterally.  ABI 01/23/2018: Moderately abnormal waveforms of the right ankle. Mildly abnormal waveforms of the left ankle. Mildly decreased right resting ABI. Mildly decreased left resting ABI. Compared to ABI 11/19/2017, normal ABI right, left was 0.7.  Abdominal Aortic Duplex 09/16/2020: The maximum aorta  diameter is 1 cm (prox). Severe plaque observed in the proximal, mid and distal aorta. Severe plaque observed in the right internal iliac and left internal iliac arteries.  Velocity is moderately elevated throughout the abdominal aorta and iliac vessels. No focal stenosis identified.  Clinical correlation recommended. Consider catheter directed angiography to evaluate the abdominal aortic stenosis.  Lower Extremity Arterial Duplex 09/16/2020: No hemodynamically significant stenoses are identified in the bilateral lower extremity arterial system.   This exam reveals moderately decreased perfusion of the right lower extremity, noted at the anterior tibial and post tibial artery level (ABI 0.68) and moderately decreased perfusion of the left lower extremity, noted at the anterior tibial and post tibial artery level (ABI 0.65).  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  Carotid artery duplex  06/21/2021:  Duplex suggests stenosis in the right internal carotid artery (1-15%). Duplex suggests  stenosis in the right external carotid artery (<50%).  Duplex suggests stenosis in the left internal carotid artery (16-49%). Duplex suggests stenosis in the left  external carotid artery (<50%).  Heterogeneous plaque noted in bilateral carotid arteries.  Antegrade right vertebral artery flow. Antegrade left vertebral artery flow.  Follow up in one year is appropriate if clinically indicated.  EKG   06/15/2021: Normal sinus rhythm at rate of 66 bpm, left axis deviation, left anterior fascicular block.  Poor R wave progression, cannot exclude anteroseptal infarct old.  IVCD, incomplete left bundle branch block, borderline criteria for LVH.  No significant change from prior EKG.  Assessment     ICD-10-CM   1. Primary hypertension  I10 amLODipine (NORVASC) 10 MG tablet    2. White coat syndrome with diagnosis of hypertension  I10     3. Mixed hyperlipidemia  E78.2     4. Carotid stenosis, asymptomatic, left  I65.22 PCV CAROTID DUPLEX (BILATERAL)     Meds ordered this encounter  Medications   amLODipine (NORVASC) 10 MG tablet    Sig: Take 1 tablet (10 mg total) by mouth daily.    Dispense:  90 tablet    Refill:  3   Medications Discontinued During This Encounter  Medication Reason   Calcium Carbonate-Vitamin D 600-400 MG-UNIT tablet    amLODipine (NORVASC) 10 MG tablet Reorder    No orders of the defined types were placed in this encounter.    Recommendations:   Brianna Hendricks is a 81 y.o. [place] female with history of chronic ILBBB, hypertension, hyperlipidemia, diabetes mellitus and PAD S/P left iliac artery stent in 2019.   Patient was seen 06/15/2021 for follow-up of claudication at which time vascular examination was stable.  LDL is elevated, therefore increase Crestor from 5 mg to 20 mg daily, repeat lipid profile testing shows lipids are under excellent control with LDL of 34.  Also last office visit blood pressure was uncontrolled, therefore added bisoprolol  10 mg daily.  Since last office visit patient underwent carotid artery duplex which showed mild left ICA disease.  She is tolerating Crestor 20 mg and also bisoprolol without any side effects.  Blood pressure is still uncontrolled.  Will increase amlodipine to 10 mg.  However patient states that blood pressure has been relatively well controlled at home and occasionally >150 mmHg.  Both husband and wife are present, they will continue to monitor the blood pressure and let us know if greater than 140/80 mmHg.  She has prominent abdominal bruit however During peripheral arteriogram on 12/25/2017, renal arteries are widely patent.  We will consider addition of diuretics to her present medical regimen if blood pressure is not well controlled.  Discussed with Dr. Woody Seller, Brianna Hendricks her primary care physician, to consider outpatient blood pressure monitoring.  Office visit in 6 months.   Adrian Prows, MD, [facility] 07/27/2021, 5:11 PM Office: [phone] Pager: [phone]

## 2021-08-17 ENCOUNTER — Telehealth: Payer: Self-pay

## 2021-08-17 NOTE — Telephone Encounter (Signed)
Pts husband called and stated that the pt has been having leg pain and difficulty sleeping since we raised the dose of rosuvastatin from 5 mg to 20 mg. He stated that she has to get out of bed a few times a night to stretch her legs because they are in pain. They are unsure of what to do. Please advise.

## 2021-08-17 NOTE — Telephone Encounter (Signed)
Can cut to 10 mg daily and f/u PCP in 2 months for lab check. If LDL >70 then need to add Zetia 10 mg

## 2021-08-17 NOTE — Telephone Encounter (Signed)
Called and spoke to pts husband, he voiced understanding and will relay the message to the pt.

## 2021-08-19 DIAGNOSIS — I779 Disorder of arteries and arterioles, unspecified: Secondary | ICD-10-CM | POA: Diagnosis not present

## 2021-08-19 DIAGNOSIS — E1140 Type 2 diabetes mellitus with diabetic neuropathy, unspecified: Secondary | ICD-10-CM | POA: Diagnosis not present

## 2021-08-19 DIAGNOSIS — Z681 Body mass index (BMI) 19 or less, adult: Secondary | ICD-10-CM | POA: Diagnosis not present

## 2021-08-19 DIAGNOSIS — Z299 Encounter for prophylactic measures, unspecified: Secondary | ICD-10-CM | POA: Diagnosis not present

## 2021-08-19 DIAGNOSIS — I10 Essential (primary) hypertension: Secondary | ICD-10-CM | POA: Diagnosis not present

## 2021-08-19 DIAGNOSIS — Z789 Other specified health status: Secondary | ICD-10-CM | POA: Diagnosis not present

## 2021-08-19 DIAGNOSIS — E1165 Type 2 diabetes mellitus with hyperglycemia: Secondary | ICD-10-CM | POA: Diagnosis not present

## 2021-08-21 DIAGNOSIS — E1169 Type 2 diabetes mellitus with other specified complication: Secondary | ICD-10-CM | POA: Diagnosis not present

## 2021-09-20 DIAGNOSIS — E1169 Type 2 diabetes mellitus with other specified complication: Secondary | ICD-10-CM | POA: Diagnosis not present

## 2021-09-28 ENCOUNTER — Other Ambulatory Visit: Payer: Self-pay | Admitting: Cardiology

## 2021-09-28 DIAGNOSIS — I10 Essential (primary) hypertension: Secondary | ICD-10-CM

## 2021-09-29 DIAGNOSIS — Z299 Encounter for prophylactic measures, unspecified: Secondary | ICD-10-CM | POA: Diagnosis not present

## 2021-09-29 DIAGNOSIS — Z789 Other specified health status: Secondary | ICD-10-CM | POA: Diagnosis not present

## 2021-09-29 DIAGNOSIS — L03114 Cellulitis of left upper limb: Secondary | ICD-10-CM | POA: Diagnosis not present

## 2021-09-29 DIAGNOSIS — I10 Essential (primary) hypertension: Secondary | ICD-10-CM | POA: Diagnosis not present

## 2021-09-29 DIAGNOSIS — M778 Other enthesopathies, not elsewhere classified: Secondary | ICD-10-CM | POA: Diagnosis not present

## 2021-10-20 DIAGNOSIS — E1169 Type 2 diabetes mellitus with other specified complication: Secondary | ICD-10-CM | POA: Diagnosis not present

## 2021-11-07 ENCOUNTER — Other Ambulatory Visit: Payer: Self-pay | Admitting: Cardiology

## 2021-11-07 DIAGNOSIS — I10 Essential (primary) hypertension: Secondary | ICD-10-CM

## 2021-11-09 ENCOUNTER — Other Ambulatory Visit: Payer: Self-pay

## 2021-11-09 DIAGNOSIS — I10 Essential (primary) hypertension: Secondary | ICD-10-CM

## 2021-11-09 MED ORDER — BISOPROLOL FUMARATE 10 MG PO TABS: 10.0000 mg | ORAL_TABLET | Freq: Every day | ORAL | 3 refills | Status: DC

## 2021-11-20 DIAGNOSIS — E1169 Type 2 diabetes mellitus with other specified complication: Secondary | ICD-10-CM | POA: Diagnosis not present

## 2021-11-24 DIAGNOSIS — Z299 Encounter for prophylactic measures, unspecified: Secondary | ICD-10-CM | POA: Diagnosis not present

## 2021-11-24 DIAGNOSIS — E1165 Type 2 diabetes mellitus with hyperglycemia: Secondary | ICD-10-CM | POA: Diagnosis not present

## 2021-11-24 DIAGNOSIS — E1140 Type 2 diabetes mellitus with diabetic neuropathy, unspecified: Secondary | ICD-10-CM | POA: Diagnosis not present

## 2021-11-24 DIAGNOSIS — R432 Parageusia: Secondary | ICD-10-CM | POA: Diagnosis not present

## 2021-11-24 DIAGNOSIS — I10 Essential (primary) hypertension: Secondary | ICD-10-CM | POA: Diagnosis not present

## 2021-12-28 ENCOUNTER — Ambulatory Visit: Payer: Medicare Other | Admitting: Diagnostic Neuroimaging

## 2022-01-19 DIAGNOSIS — E1169 Type 2 diabetes mellitus with other specified complication: Secondary | ICD-10-CM | POA: Diagnosis not present

## 2022-01-26 ENCOUNTER — Other Ambulatory Visit: Payer: Self-pay

## 2022-01-26 ENCOUNTER — Encounter: Payer: Self-pay | Admitting: Cardiology

## 2022-01-26 ENCOUNTER — Ambulatory Visit: Payer: Medicare Other | Admitting: Cardiology

## 2022-01-26 VITALS — BP 154/52 | HR 64 | Temp 97.8°F | Resp 16 | Ht <= 58 in | Wt 110.0 lb

## 2022-01-26 DIAGNOSIS — I10 Essential (primary) hypertension: Secondary | ICD-10-CM

## 2022-01-26 DIAGNOSIS — I6522 Occlusion and stenosis of left carotid artery: Secondary | ICD-10-CM | POA: Diagnosis not present

## 2022-01-26 DIAGNOSIS — I739 Peripheral vascular disease, unspecified: Secondary | ICD-10-CM | POA: Diagnosis not present

## 2022-01-26 DIAGNOSIS — E782 Mixed hyperlipidemia: Secondary | ICD-10-CM

## 2022-01-26 MED ORDER — NEBIVOLOL HCL 20 MG PO TABS: 20.0000 mg | ORAL_TABLET | Freq: Every day | ORAL | 2 refills | Status: DC

## 2022-01-26 NOTE — Progress Notes (Signed)
Primary Physician/Referring:  Ignatius Specking, MD  Patient ID: Brianna Hendricks, female    DOB: [birth year], 81 y.o.   MRN: [id number]  Chief Complaint  Patient presents with   PAD   Hypertension   Follow-up    6 MONTH   HPI:    HPI: Brianna Hendricks  is a 81 y.o. Asian [place] female with history of chronic ILBBB, hypertension, hyperlipidemia, diabetes mellitus and PAD S/P left iliac artery stent in 2019.  She presents for a 79-month office visit, presently doing well and denies any chest pain or dyspnea, denies symptoms of claudication.  She is tolerating her medications well.  Blood pressure continues to remain elevated >140 mmHg.  No symptoms of headache or TIA.  Past Medical History:  Diagnosis Date   Arthritis    "some in my" 4th digit right hand  (12/25/2017)   High cholesterol    History of blood transfusion    "related to female bleeding"   Hypertension    PAD (peripheral artery disease) (HCC)    Stroke (HCC) 1998   "fully recovered" (12/25/2017)   Type II diabetes mellitus (HCC)    Past Surgical History:  Procedure Laterality Date   ABDOMINAL AORTOGRAM N/A 12/25/2017   Procedure: ABDOMINAL AORTOGRAM;  Surgeon: Yates Decamp, MD;  Location: [facility];  Service: Cardiovascular;  Laterality: N/A;   CATARACT EXTRACTION W/ INTRAOCULAR LENS  IMPLANT, BILATERAL Bilateral    CHOLECYSTECTOMY     LOWER EXTREMITY ANGIOGRAPHY Bilateral 12/25/2017   Procedure: LOWER EXTREMITY ANGIOGRAPHY;  Surgeon: Yates Decamp, MD;  Location: [facility];  Service: Cardiovascular;  Laterality: Bilateral;   PERIPHERAL VASCULAR ATHERECTOMY Left 12/25/2017   Procedure: PERIPHERAL VASCULAR ATHERECTOMY;  Surgeon: Yates Decamp, MD;  Location: [facility] INVAS[facility] CV [facility];  Service: Cardiovascular;  Laterality: Left;  common iliac   PERIPHERAL VASCULAR INTERVENTION Left 12/25/2017   Procedure: PERIPHERAL VASCULAR INTERVENTION;  Surgeon: Yates Decamp, MD;  Location: [facility];  Service:  Cardiovascular;  Laterality: Left;  common iliac   THYROID SURGERY  1972   " not sure what was done:"   TONSILLECTOMY     Family History  Problem Relation Age of Onset   Heart attack Father    Social History   Tobacco Use   Smoking status: Never   Smokeless tobacco: Never  Substance Use Topics   Alcohol use: Never  Marital Status: Married    ROS  Review of Systems  Cardiovascular:  Negative for chest pain, claudication, dyspnea on exertion and leg swelling.  Endocrine: Positive for cold intolerance.  Musculoskeletal:  Positive for arthritis.  Gastrointestinal:  Negative for melena.  Neurological:  Positive for paresthesias (feet and hands).   Objective  Blood pressure (!) 154/52, pulse 64, temperature 97.8 F (36.6 C), resp. rate 16, height 4\' 10"  (1.473 m), weight 110 lb (49.9 kg), SpO2 100 %. Body mass index is 22.99 kg/m.      01/26/2022    3:19 PM 07/27/2021    4:25 PM 07/27/2021    2:58 PM  Vitals with BMI  Height 4\' 10"   4\' 10"   Weight 110 lbs  109 lbs 6 oz  BMI 23  22.87  Systolic 154 162 09/24/2021  Diastolic 52 80 50  Pulse 64  64      Physical Exam Neck:     Vascular: No JVD.  Cardiovascular:     Rate and Rhythm: Normal rate and regular rhythm.     Pulses: Intact distal  pulses.          Carotid pulses are  on the right side with bruit and  on the left side with bruit.      Femoral pulses are 2+ on the right side with bruit and 2+ on the left side with bruit.      Popliteal pulses are 2+ on the right side and 2+ on the left side.       Dorsalis pedis pulses are 2+ on the right side and 2+ on the left side.       Posterior tibial pulses are 2+ on the right side and 2+ on the left side.     Heart sounds: Normal heart sounds. No murmur heard.    No gallop.  Pulmonary:     Effort: Pulmonary effort is normal.     Breath sounds: Normal breath sounds.  Abdominal:     General: Bowel sounds are normal. There is abdominal bruit.     Palpations: Abdomen is soft.   Musculoskeletal:     Right lower leg: No edema.     Left lower leg: No edema.  Skin:    General: Skin is warm.     Capillary Refill: Capillary refill takes less than 2 seconds.    Laboratory examination:    Lipid Panel     Component Value Date/Time   CHOL 101 07/26/2021 0831   TRIG 112 07/26/2021 0831   HDL 47 07/26/2021 0831   LDLCALC 34 07/26/2021 0831   HEMOGLOBIN A1C No results found for: "HGBA1C", "MPG" TSH No results for input(s): "TSH" in the last 8760 hours.  External Labs: Cholesterol, total 220.000 M 02/17/2021 HDL 61.000 MG 02/17/2021 LDL 114 Triglycerides 166.000 M 02/17/2021  Hemoglobin 10.500 G/ 02/17/2021 Platelets 195.000 X1 02/17/2021  Creatinine, Serum 0.870 MG/ 02/17/2021 Potassium 4.000 mm 12/25/2017 ALT (SGPT) 23.000 IU/ 02/17/2021  TSH 3.690 02/17/2021   Cholesterol, total 195.000 M 02/16/2020 HDL 57.000 MG 02/16/2020 LDL 90.8 Triglycerides 236.000 M 02/16/2020  Allergies  No Known Allergies   Final Medications at End of Visit    Current Outpatient Medications:    amLODipine (NORVASC) 10 MG tablet, Take 1 tablet (10 mg total) by mouth daily., Disp: 90 tablet, Rfl: 3   aspirin EC 81 MG tablet, Take 81 mg by mouth daily., Disp: , Rfl:    baclofen (LIORESAL) 10 MG tablet, Take 10 mg by mouth daily., Disp: , Rfl:    carbamazepine (TEGRETOL) 200 MG tablet, Take 100 mg by mouth 2 (two) times daily., Disp: , Rfl:    folic acid (FOLVITE) 1 MG tablet, Take 1 mg by mouth every evening., Disp: , Rfl:    glipiZIDE (GLUCOTROL) 5 MG tablet, Take by mouth 2 (two) times daily., Disp: , Rfl:    losartan (COZAAR) 100 MG tablet, Take 50 mg by mouth daily., Disp: , Rfl:    metFORMIN (GLUCOPHAGE) 500 MG tablet, Take 500 mg by mouth 2 (two) times daily with a meal., Disp: , Rfl:    Nebivolol HCl 20 MG TABS, Take 1 tablet (20 mg total) by mouth daily., Disp: 30 tablet, Rfl: 2   pregabalin (LYRICA) 75 MG capsule, Take 75 mg by mouth daily. , Disp: , Rfl:     rosuvastatin (CRESTOR) 20 MG tablet, Take 1 tablet (20 mg total) by mouth at bedtime., Disp: 90 tablet, Rfl: 3   Radiology:  No results found.  Cardiac Studies:   Echo- 04/24/13 1.Left ventricular cavity is normal in size.  Abnormal septal motion  due to IVCD.  Lower limits systolic global function.   Calculated EF 55%.   Visual EF is 50-55%.   Doppler evidence of Grade I (impaired) diastolic dysfunction. 2.Left atrial cavity is slightly dilated. 3.Mild calcification of the mitral annulus.   Trace mitral regurgitation.   Mitral valve inflow A > E ratio. 4.Tricuspid valve structurally normal.   Mild tricuspid regurgitation.  Lexiscan Myoview stress test 04/25/2013: 1. Resting EKG NSR, LBBB. Stress EKG was non diagnostic for ischemia. No ST-T changes of ischemia noted with pharmacologic stress testing. Stress symptoms included chest pressure, sob, stomach discomfort and headache. Stress terminated due to completion of protocol. 2. The perfusion study demonstrated mild breast attenuation artifact in the anterior wall. There was no evidence of ischemia or scar. Dynamic gated images reveal normal wall motion and endocardial thickening. Left ventricular ejection fraction was estimated to be 69%.  This represents a low risk study.  Peripheral arteriogram 12/25/2017: Left common iliac artery tandem 99% calcific stenosis S/P 2.0 solid crown CSI orbital atherectomy and Viabahn. Left AT appears occluded distally. Otherwise mild disease bilaterally.  ABI 01/23/2018: Moderately abnormal waveforms of the right ankle. Mildly abnormal waveforms of the left ankle. Mildly decreased right resting ABI. Mildly decreased left resting ABI. Compared to ABI 11/19/2017, normal ABI right, left was 0.7.  Abdominal Aortic Duplex 09/16/2020: The maximum aorta  diameter is 1 cm (prox). Severe plaque observed in the proximal, mid and distal aorta. Severe plaque observed in the right internal iliac and left internal iliac  arteries.  Velocity is moderately elevated throughout the abdominal aorta and iliac vessels. No focal stenosis identified.  Clinical correlation recommended. Consider catheter directed angiography to evaluate the abdominal aortic stenosis.  Lower Extremity Arterial Duplex 09/16/2020: No hemodynamically significant stenoses are identified in the bilateral lower extremity arterial system.   This exam reveals moderately decreased perfusion of the right lower extremity, noted at the anterior tibial and post tibial artery level (ABI 0.68) and moderately decreased perfusion of the left lower extremity, noted at the anterior tibial and post tibial artery level (ABI 0.65).  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  Carotid artery duplex  06/21/2021:  Duplex suggests stenosis in the right internal carotid artery (1-15%). Duplex suggests stenosis in the right external carotid artery (<50%).  Duplex suggests stenosis in the left internal carotid artery (16-49%). Duplex suggests stenosis in the left external carotid artery (<50%).  Heterogeneous plaque noted in bilateral carotid arteries.  Antegrade right vertebral artery flow. Antegrade left vertebral artery flow.  Follow up in one year is appropriate if clinically indicated.  EKG   EKG 01/26/2022: Normal sinus rhythm at rate of 61 bpm, left axis deviation, left anterior fascicular block.  IVCD, atypical left bundle branch block.  Nonspecific T abnormality.  No significant change from 06/15/2021.    Assessment     ICD-10-CM   1. Primary hypertension  I10 EKG 12-Lead    Nebivolol HCl 20 MG TABS    2. Mixed hyperlipidemia  E78.2     3. PAD (peripheral artery disease) (HCC)  I73.9      Meds ordered this encounter  Medications   Nebivolol HCl 20 MG TABS    Sig: Take 1 tablet (20 mg total) by mouth daily.    Dispense:  30 tablet    Refill:  2    Discontinue Bisoprolol   Medications Discontinued During This Encounter  Medication Reason    bisoprolol (ZEBETA) 10 MG tablet Change in therapy  Orders Placed This Encounter  Procedures   EKG 12-Lead   Recommendations:   Brianna Hendricks is a 81 y.o. Asian [place] female with history of chronic ILBBB, hypertension, hyperlipidemia, diabetes mellitus and PAD S/P left iliac artery stent in 2019.   She presents for a 36-month office visit. No change in EKG. BP continues to be uncontrolled. I will discontinue Bisoprolol and switch to nebivolol 20 mg daily.  I would like to see her back in 6 weeks for follow-up of hypertension.  From vascular standpoint she is doing well without symptoms of claudication and normal vascular examination except for bilateral carotid bruit and bilateral femoral bruit, pedal pulses are excellent and normal.  She is presently tolerating high intensity statins Crestor 20 mg daily lipids are in excellent control.  Continue the same.  Diabetes continues to be uncontrolled.  This is being managed by PCP.  Could consider GLP-1 receptor agonist both from cardiovascular standpoint and for diabetes mellitus controlled.  I will leave this to her PCP for further evaluation of the same.  Otherwise stable from cardiac standpoint, I will see her back in a year or sooner if problems.   Yates Decamp, MD, [facility] 01/26/2022, 4:12 PM Office: [phone] Pager: [phone]

## 2022-02-20 DIAGNOSIS — R5383 Other fatigue: Secondary | ICD-10-CM | POA: Diagnosis not present

## 2022-02-20 DIAGNOSIS — Z681 Body mass index (BMI) 19 or less, adult: Secondary | ICD-10-CM | POA: Diagnosis not present

## 2022-02-20 DIAGNOSIS — Z1339 Encounter for screening examination for other mental health and behavioral disorders: Secondary | ICD-10-CM | POA: Diagnosis not present

## 2022-02-20 DIAGNOSIS — Z79899 Other long term (current) drug therapy: Secondary | ICD-10-CM | POA: Diagnosis not present

## 2022-02-20 DIAGNOSIS — E1169 Type 2 diabetes mellitus with other specified complication: Secondary | ICD-10-CM | POA: Diagnosis not present

## 2022-02-20 DIAGNOSIS — I10 Essential (primary) hypertension: Secondary | ICD-10-CM | POA: Diagnosis not present

## 2022-02-20 DIAGNOSIS — Z0000 Encounter for general adult medical examination without abnormal findings: Secondary | ICD-10-CM | POA: Diagnosis not present

## 2022-02-20 DIAGNOSIS — Z1331 Encounter for screening for depression: Secondary | ICD-10-CM | POA: Diagnosis not present

## 2022-02-20 DIAGNOSIS — E7800 Pure hypercholesterolemia, unspecified: Secondary | ICD-10-CM | POA: Diagnosis not present

## 2022-02-20 DIAGNOSIS — Z299 Encounter for prophylactic measures, unspecified: Secondary | ICD-10-CM | POA: Diagnosis not present

## 2022-02-20 DIAGNOSIS — Z7189 Other specified counseling: Secondary | ICD-10-CM | POA: Diagnosis not present

## 2022-02-28 DIAGNOSIS — Z299 Encounter for prophylactic measures, unspecified: Secondary | ICD-10-CM | POA: Diagnosis not present

## 2022-02-28 DIAGNOSIS — I10 Essential (primary) hypertension: Secondary | ICD-10-CM | POA: Diagnosis not present

## 2022-02-28 DIAGNOSIS — E1165 Type 2 diabetes mellitus with hyperglycemia: Secondary | ICD-10-CM | POA: Diagnosis not present

## 2022-02-28 DIAGNOSIS — E7800 Pure hypercholesterolemia, unspecified: Secondary | ICD-10-CM | POA: Diagnosis not present

## 2022-02-28 DIAGNOSIS — Z6820 Body mass index (BMI) 20.0-20.9, adult: Secondary | ICD-10-CM | POA: Diagnosis not present

## 2022-03-01 DIAGNOSIS — E119 Type 2 diabetes mellitus without complications: Secondary | ICD-10-CM | POA: Diagnosis not present

## 2022-03-01 DIAGNOSIS — Z961 Presence of intraocular lens: Secondary | ICD-10-CM | POA: Diagnosis not present

## 2022-03-01 DIAGNOSIS — H18513 Endothelial corneal dystrophy, bilateral: Secondary | ICD-10-CM | POA: Diagnosis not present

## 2022-03-01 DIAGNOSIS — H31092 Other chorioretinal scars, left eye: Secondary | ICD-10-CM | POA: Diagnosis not present

## 2022-03-01 DIAGNOSIS — H26493 Other secondary cataract, bilateral: Secondary | ICD-10-CM | POA: Diagnosis not present

## 2022-03-01 DIAGNOSIS — H179 Unspecified corneal scar and opacity: Secondary | ICD-10-CM | POA: Diagnosis not present

## 2022-03-09 ENCOUNTER — Encounter: Payer: Self-pay | Admitting: Cardiology

## 2022-03-09 ENCOUNTER — Ambulatory Visit: Payer: Medicare Other | Admitting: Cardiology

## 2022-03-09 VITALS — BP 154/62 | HR 63 | Temp 97.6°F | Resp 16 | Ht <= 58 in | Wt 110.2 lb

## 2022-03-09 DIAGNOSIS — I10 Essential (primary) hypertension: Secondary | ICD-10-CM

## 2022-03-09 DIAGNOSIS — I6522 Occlusion and stenosis of left carotid artery: Secondary | ICD-10-CM

## 2022-03-09 MED ORDER — NEBIVOLOL HCL 20 MG PO TABS: 20.0000 mg | ORAL_TABLET | Freq: Every day | ORAL | 3 refills | Status: DC

## 2022-03-09 MED ORDER — SPIRONOLACTONE 25 MG PO TABS: 12.5000 mg | ORAL_TABLET | ORAL | 2 refills | Status: DC

## 2022-03-09 NOTE — Progress Notes (Signed)
Primary Physician/Referring:  Ignatius Specking, MD  Patient ID: Brianna Hendricks, female    DOB: [birth year], 81 y.o.   MRN: [id number]  Chief Complaint  Patient presents with   Hypertension   Follow-up    6 weeks   HPI:    HPI: Brianna Hendricks  is a 81 y.o. Asian [place] female with history of chronic ILBBB, hypertension, hyperlipidemia, diabetes mellitus and PAD S/P left iliac artery stent in 2019.   I had seen her 6 weeks ago, had added bisoprolol for hypertension which she did not tolerate has changed over to Bystolic 10 mg.  Blood pressure continues to remain elevated >140 mmHg.  No symptoms of headache or TIA.  Past Medical History:  Diagnosis Date   Arthritis    "some in my" 4th digit right hand  (12/25/2017)   High cholesterol    History of blood transfusion    "related to female bleeding"   Hypertension    PAD (peripheral artery disease) (HCC)    Stroke (HCC) 1998   "fully recovered" (12/25/2017)   Type II diabetes mellitus (HCC)    Past Surgical History:  Procedure Laterality Date   ABDOMINAL AORTOGRAM N/A 12/25/2017   Procedure: ABDOMINAL AORTOGRAM;  Surgeon: Yates Decamp, MD;  Location: [facility];  Service: Cardiovascular;  Laterality: N/A;   CATARACT EXTRACTION W/ INTRAOCULAR LENS  IMPLANT, BILATERAL Bilateral    CHOLECYSTECTOMY     LOWER EXTREMITY ANGIOGRAPHY Bilateral 12/25/2017   Procedure: LOWER EXTREMITY ANGIOGRAPHY;  Surgeon: Yates Decamp, MD;  Location: [facility];  Service: Cardiovascular;  Laterality: Bilateral;   PERIPHERAL VASCULAR ATHERECTOMY Left 12/25/2017   Procedure: PERIPHERAL VASCULAR ATHERECTOMY;  Surgeon: Yates Decamp, MD;  Location: [facility] INVAS[facility] CV LAB;  Service: Cardiovascular;  Laterality: Left;  common iliac   PERIPHERAL VASCULAR INTERVENTION Left 12/25/2017   Procedure: PERIPHERAL VASCULAR INTERVENTION;  Surgeon: Yates Decamp, MD;  Location: [facility];  Service: Cardiovascular;  Laterality: Left;  common iliac    THYROID SURGERY  1972   " not sure what was done:"   TONSILLECTOMY     Family History  Problem Relation Age of Onset   Heart attack Father    Social History   Tobacco Use   Smoking status: Never   Smokeless tobacco: Never  Substance Use Topics   Alcohol use: Never  Marital Status: Married    ROS  Review of Systems  Cardiovascular:  Negative for chest pain, claudication, dyspnea on exertion and leg swelling.  Neurological:  Positive for paresthesias (feet and hands).   Objective  Blood pressure (!) 154/62, pulse 63, temperature 97.6 F (36.4 C), temperature source Temporal, resp. rate 16, height 4\' 10"  (1.473 m), weight 110 lb 3.2 oz (50 kg), SpO2 100 %. Body mass index is 23.03 kg/m.      03/09/2022    3:28 PM 01/26/2022    3:19 PM 07/27/2021    4:25 PM  Vitals with BMI  Height 4\' 10"  4\' 10"    Weight 110 lbs 3 oz 110 lbs   BMI 23.04 23   Systolic 154 154 09/24/2021  Diastolic 62 52 80  Pulse 63 64       Physical Exam Neck:     Vascular: No carotid bruit or JVD.  Cardiovascular:     Rate and Rhythm: Normal rate and regular rhythm.     Pulses: Intact distal pulses.          Carotid pulses are  on the  right side with bruit and  on the left side with bruit.    Heart sounds: Normal heart sounds. No murmur heard.    No gallop.  Pulmonary:     Effort: Pulmonary effort is normal.     Breath sounds: Normal breath sounds.  Abdominal:     General: Bowel sounds are normal. There is abdominal bruit.     Palpations: Abdomen is soft.  Musculoskeletal:     Right lower leg: No edema.     Left lower leg: No edema.  Skin:    Capillary Refill: Capillary refill takes less than 2 seconds.    Laboratory examination:    Lipid Panel     Component Value Date/Time   CHOL 101 07/26/2021 0831   TRIG 112 07/26/2021 0831   HDL 47 07/26/2021 0831   LDLCALC 34 07/26/2021 0831   HEMOGLOBIN A1C No results found for: "HGBA1C", "MPG" TSH No results for input(s): "TSH" in the last 8760  hours.  External Labs: Cholesterol, total 220.000 M 02/17/2021 HDL 61.000 MG 02/17/2021 LDL 114 Triglycerides 166.000 M 02/17/2021  Hemoglobin 10.500 G/ 02/17/2021 Platelets 195.000 X1 02/17/2021  Creatinine, Serum 0.870 MG/ 02/17/2021 Potassium 4.000 mm 12/25/2017 ALT (SGPT) 23.000 IU/ 02/17/2021  TSH 3.690 02/17/2021  Allergies  No Known Allergies   Final Medications at End of Visit    Current Outpatient Medications:    amLODipine (NORVASC) 10 MG tablet, Take 1 tablet (10 mg total) by mouth daily., Disp: 90 tablet, Rfl: 3   aspirin EC 81 MG tablet, Take 81 mg by mouth daily., Disp: , Rfl:    baclofen (LIORESAL) 10 MG tablet, Take 10 mg by mouth daily., Disp: , Rfl:    carbamazepine (TEGRETOL) 200 MG tablet, Take 100 mg by mouth 2 (two) times daily., Disp: , Rfl:    folic acid (FOLVITE) 1 MG tablet, Take 1 mg by mouth every evening., Disp: , Rfl:    glipiZIDE (GLUCOTROL) 5 MG tablet, Take by mouth 2 (two) times daily., Disp: , Rfl:    losartan (COZAAR) 100 MG tablet, Take 50 mg by mouth daily., Disp: , Rfl:    metFORMIN (GLUCOPHAGE) 500 MG tablet, Take 500 mg by mouth 2 (two) times daily with a meal., Disp: , Rfl:    pregabalin (LYRICA) 75 MG capsule, Take 75 mg by mouth daily. , Disp: , Rfl:    rosuvastatin (CRESTOR) 20 MG tablet, Take 1 tablet (20 mg total) by mouth at bedtime., Disp: 90 tablet, Rfl: 3   spironolactone (ALDACTONE) 25 MG tablet, Take 0.5 tablets (12.5 mg total) by mouth every morning., Disp: 15 tablet, Rfl: 2   nebivolol 20 MG TABS, Take 1 tablet (20 mg total) by mouth daily., Disp: 90 tablet, Rfl: 3   Radiology:  No results found.  Cardiac Studies:   Echo- 04/24/13 1.Left ventricular cavity is normal in size.  Abnormal septal motion due to IVCD.  Lower limits systolic global function.   Calculated EF 55%.   Visual EF is 50-55%.   Doppler evidence of Grade I (impaired) diastolic dysfunction. 2.Left atrial cavity is slightly dilated. 3.Mild calcification of the  mitral annulus.   Trace mitral regurgitation.   Mitral valve inflow A > E ratio. 4.Tricuspid valve structurally normal.   Mild tricuspid regurgitation.  Lexiscan Myoview stress test 04/25/2013: 1. Resting EKG NSR, LBBB. Stress EKG was non diagnostic for ischemia. No ST-T changes of ischemia noted with pharmacologic stress testing. Stress symptoms included chest pressure, sob, stomach discomfort and headache. Stress terminated  due to completion of protocol. 2. The perfusion study demonstrated mild breast attenuation artifact in the anterior wall. There was no evidence of ischemia or scar. Dynamic gated images reveal normal wall motion and endocardial thickening. Left ventricular ejection fraction was estimated to be 69%.  This represents a low risk study.  Peripheral arteriogram 12/25/2017: Left common iliac artery tandem 99% calcific stenosis S/P 2.0 solid crown CSI orbital atherectomy and Viabahn. Left AT appears occluded distally. Otherwise mild disease bilaterally.  ABI 01/23/2018: Moderately abnormal waveforms of the right ankle. Mildly abnormal waveforms of the left ankle. Mildly decreased right resting ABI. Mildly decreased left resting ABI. Compared to ABI 11/19/2017, normal ABI right, left was 0.7.  Abdominal Aortic Duplex 09/16/2020: The maximum aorta  diameter is 1 cm (prox). Severe plaque observed in the proximal, mid and distal aorta. Severe plaque observed in the right internal iliac and left internal iliac arteries.  Velocity is moderately elevated throughout the abdominal aorta and iliac vessels. No focal stenosis identified.  Clinical correlation recommended. Consider catheter directed angiography to evaluate the abdominal aortic stenosis.  Lower Extremity Arterial Duplex 09/16/2020: No hemodynamically significant stenoses are identified in the bilateral lower extremity arterial system.   This exam reveals moderately decreased perfusion of the right lower extremity, noted at the  anterior tibial and post tibial artery level (ABI 0.68) and moderately decreased perfusion of the left lower extremity, noted at the anterior tibial and post tibial artery level (ABI 0.65).  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  Carotid artery duplex  06/21/2021:  Duplex suggests stenosis in the right internal carotid artery (1-15%). Duplex suggests stenosis in the right external carotid artery (<50%).  Duplex suggests stenosis in the left internal carotid artery (16-49%). Duplex suggests stenosis in the left external carotid artery (<50%).  Heterogeneous plaque noted in bilateral carotid arteries.  Antegrade right vertebral artery flow. Antegrade left vertebral artery flow.  Follow up in one year is appropriate if clinically indicated.  EKG   EKG 01/26/2022: Normal sinus rhythm at rate of 61 bpm, left axis deviation, left anterior fascicular block.  IVCD, atypical left bundle branch block.  Nonspecific T abnormality.  No significant change from 06/15/2021.    Assessment     ICD-10-CM   1. Primary hypertension  I10 nebivolol 20 MG TABS    spironolactone (ALDACTONE) 25 MG tablet    Basic metabolic panel    2. Carotid stenosis, asymptomatic, left  I65.22      Meds ordered this encounter  Medications   nebivolol 20 MG TABS    Sig: Take 1 tablet (20 mg total) by mouth daily.    Dispense:  90 tablet    Refill:  3   spironolactone (ALDACTONE) 25 MG tablet    Sig: Take 0.5 tablets (12.5 mg total) by mouth every morning.    Dispense:  15 tablet    Refill:  2   Medications Discontinued During This Encounter  Medication Reason   Nebivolol HCl 20 MG TABS    bisoprolol (ZEBETA) 10 MG tablet Change in therapy   nebivolol (BYSTOLIC) 10 MG tablet Reorder     Orders Placed This Encounter  Procedures   Basic metabolic panel   Recommendations:   Brianna Hendricks is a 81 y.o. Asian [place] female with history of chronic ILBBB, hypertension, hyperlipidemia, diabetes  mellitus and PAD S/P left iliac artery stent in 2019.   I had seen her 6 weeks ago, had added bisoprolol for hypertension which she did  not tolerate has changed over to Bystolic 10 mg.  Blood pressure is still elevated, will increase the nadolol to 20 mg daily, I will also add spironolactone 25 mg 1/2 tablet in the morning.  She will obtain BMP 2 to 3 weeks after starting spironolactone.  Patient is presently contemplating on vacation to go to [place], advised him to start spironolactone after returning from the trip.  No clinical evidence of heart failure.  I would like to see her back in 2 months for follow-up of hypertension specifically.  With regard to peripheral artery disease, symptoms of claudication is stable.  Lipids are well controlled.    Yates Decamp, MD, [facility] 03/12/2022, 5:35 PM Office: [phone] Pager: [phone]

## 2022-03-09 NOTE — Patient Instructions (Signed)
Please get blood work done 2 to 3 weeks after you start spironolactone.  Do not start spironolactone until you return from the close.

## 2022-03-28 DIAGNOSIS — Z299 Encounter for prophylactic measures, unspecified: Secondary | ICD-10-CM | POA: Diagnosis not present

## 2022-03-28 DIAGNOSIS — I779 Disorder of arteries and arterioles, unspecified: Secondary | ICD-10-CM | POA: Diagnosis not present

## 2022-03-28 DIAGNOSIS — E1165 Type 2 diabetes mellitus with hyperglycemia: Secondary | ICD-10-CM | POA: Diagnosis not present

## 2022-03-28 DIAGNOSIS — U071 COVID-19: Secondary | ICD-10-CM | POA: Diagnosis not present

## 2022-03-28 DIAGNOSIS — I10 Essential (primary) hypertension: Secondary | ICD-10-CM | POA: Diagnosis not present

## 2022-03-31 ENCOUNTER — Other Ambulatory Visit: Payer: Self-pay | Admitting: Cardiology

## 2022-03-31 DIAGNOSIS — I10 Essential (primary) hypertension: Secondary | ICD-10-CM

## 2022-04-17 ENCOUNTER — Encounter: Payer: Self-pay | Admitting: Cardiology

## 2022-04-17 ENCOUNTER — Ambulatory Visit: Payer: Medicare Other | Admitting: Cardiology

## 2022-04-17 VITALS — BP 140/56 | HR 61 | Temp 97.3°F | Resp 16 | Ht <= 58 in | Wt 110.2 lb

## 2022-04-17 DIAGNOSIS — I6522 Occlusion and stenosis of left carotid artery: Secondary | ICD-10-CM | POA: Diagnosis not present

## 2022-04-17 DIAGNOSIS — E1142 Type 2 diabetes mellitus with diabetic polyneuropathy: Secondary | ICD-10-CM | POA: Diagnosis not present

## 2022-04-17 DIAGNOSIS — I739 Peripheral vascular disease, unspecified: Secondary | ICD-10-CM | POA: Diagnosis not present

## 2022-04-17 DIAGNOSIS — I10 Essential (primary) hypertension: Secondary | ICD-10-CM | POA: Diagnosis not present

## 2022-04-17 MED ORDER — NEBIVOLOL HCL 20 MG PO TABS: 1.0000 | ORAL_TABLET | Freq: Every day | ORAL | 3 refills | Status: DC

## 2022-04-17 MED ORDER — EMPAGLIFLOZIN 25 MG PO TABS: 25.0000 mg | ORAL_TABLET | Freq: Every day | ORAL | 6 refills | Status: DC

## 2022-04-17 NOTE — Progress Notes (Signed)
Primary Physician/Referring:  Ignatius Specking, MD  Patient ID: Marcelle Overlie, female    DOB: Apr 02, 1941, 81 y.o.   MRN: [id number]  No chief complaint on file.  HPI:    HPI: Kjerstin Brytni Dray  is a 80 y.o. Asian [place] female with history of chronic ILBBB, hypertension, hyperlipidemia, diabetes mellitus and PAD S/P left iliac artery stent in 2019.   I had seen her 6 weeks ago, I had added spironolactone 12.5 mg and also increase the dose of Bystolic to 20 mg daily.  She now presents for follow-up.  Over the past 3 to 4 weeks she has developed diarrhea but otherwise no other specific complaints.  Past Medical History:  Diagnosis Date   Arthritis    "some in my" 4th digit right hand  (12/25/2017)   High cholesterol    History of blood transfusion    "related to female bleeding"   Hypertension    PAD (peripheral artery disease) (HCC)    Stroke (HCC) 1998   "fully recovered" (12/25/2017)   Type II diabetes mellitus (HCC)    Past Surgical History:  Procedure Laterality Date   ABDOMINAL AORTOGRAM N/A 12/25/2017   Procedure: ABDOMINAL AORTOGRAM;  Surgeon: Yates Decamp, MD;  Location: [facility];  Service: Cardiovascular;  Laterality: N/A;   CATARACT EXTRACTION W/ INTRAOCULAR LENS  IMPLANT, BILATERAL Bilateral    CHOLECYSTECTOMY     LOWER EXTREMITY ANGIOGRAPHY Bilateral 12/25/2017   Procedure: LOWER EXTREMITY ANGIOGRAPHY;  Surgeon: Yates Decamp, MD;  Location: [facility];  Service: Cardiovascular;  Laterality: Bilateral;   PERIPHERAL VASCULAR ATHERECTOMY Left 12/25/2017   Procedure: PERIPHERAL VASCULAR ATHERECTOMY;  Surgeon: Yates Decamp, MD;  Location: [facility];  Service: Cardiovascular;  Laterality: Left;  common iliac   PERIPHERAL VASCULAR INTERVENTION Left 12/25/2017   Procedure: PERIPHERAL VASCULAR INTERVENTION;  Surgeon: Yates Decamp, MD;  Location: [facility];  Service: Cardiovascular;  Laterality: Left;  common iliac   THYROID SURGERY   1972   " not sure what was done:"   TONSILLECTOMY     Family History  Problem Relation Age of Onset   Heart attack Father    Social History   Tobacco Use   Smoking status: Never   Smokeless tobacco: Never  Substance Use Topics   Alcohol use: Never  Marital Status: Married    ROS  Review of Systems  Cardiovascular:  Negative for chest pain, claudication, dyspnea on exertion and leg swelling.  Neurological:  Positive for paresthesias (feet and hands).   Objective  Blood pressure (!) 140/56, pulse 61, temperature (!) 97.3 F (36.3 C), temperature source Temporal, resp. rate 16, height 4\' 10"  (1.473 m), weight 110 lb 3.2 oz (50 kg), SpO2 100 %. Body mass index is 23.03 kg/m.      04/17/2022    3:35 PM 04/17/2022    3:28 PM 03/09/2022    3:28 PM  Vitals with BMI  Height  4\' 10"  4\' 10"   Weight  110 lbs 3 oz 110 lbs 3 oz  BMI  23.04 23.04  Systolic 140 155 03/11/2022  Diastolic 56 54 62  Pulse 61 59 63      Physical Exam Neck:     Vascular: No carotid bruit or JVD.  Cardiovascular:     Rate and Rhythm: Normal rate and regular rhythm.     Pulses: Intact distal pulses.          Carotid pulses are  on the right side  with bruit and  on the left side with bruit.    Heart sounds: Normal heart sounds. No murmur heard.    No gallop.  Pulmonary:     Effort: Pulmonary effort is normal.     Breath sounds: Normal breath sounds.  Abdominal:     General: Bowel sounds are normal. There is abdominal bruit.     Palpations: Abdomen is soft.  Musculoskeletal:     Right lower leg: No edema.     Left lower leg: No edema.  Skin:    Capillary Refill: Capillary refill takes less than 2 seconds.    Laboratory examination:   Lipid Panel     Component Value Date/Time   CHOL 101 07/26/2021 0831   TRIG 112 07/26/2021 0831   HDL 47 07/26/2021 0831   LDLCALC 34 07/26/2021 0831    External Labs:  Creatinine, Serum 0.870 MG/ 02/17/2021 Potassium 5.300 mm 02/17/2021  A1C 8.000 %  11/24/2021  Cholesterol, total 220.000 M 02/17/2021 HDL 61.000 MG 02/17/2021 LDL 114 Triglycerides 166.000 M 02/17/2021  Hemoglobin 10.500 G/ 02/17/2021 Platelets 195.000 X1 02/17/2021  Creatinine, Serum 0.870 MG/ 02/17/2021 Potassium 4.000 mm 12/25/2017 ALT (SGPT) 23.000 IU/ 02/17/2021  TSH 3.690 02/17/2021  Allergies  No Known Allergies   Final Medications at End of Visit    Current Outpatient Medications:    amLODipine (NORVASC) 10 MG tablet, Take 1 tablet (10 mg total) by mouth daily., Disp: 90 tablet, Rfl: 3   aspirin EC 81 MG tablet, Take 81 mg by mouth daily., Disp: , Rfl:    carbamazepine (TEGRETOL) 200 MG tablet, Take 100 mg by mouth 2 (two) times daily., Disp: , Rfl:    cyanocobalamin (VITAMIN B12) 1000 MCG tablet, Take 1,000 mcg by mouth daily., Disp: , Rfl:    empagliflozin (JARDIANCE) 25 MG TABS tablet, Take 1 tablet (25 mg total) by mouth daily before breakfast., Disp: 30 tablet, Rfl: 6   folic acid (FOLVITE) 1 MG tablet, Take 1 mg by mouth every evening., Disp: , Rfl:    glipiZIDE (GLUCOTROL) 5 MG tablet, Take by mouth 2 (two) times daily., Disp: , Rfl:    losartan (COZAAR) 100 MG tablet, Take 50 mg by mouth daily., Disp: , Rfl:    metFORMIN (GLUCOPHAGE) 500 MG tablet, Take 500 mg by mouth 2 (two) times daily with a meal., Disp: , Rfl:    Nebivolol HCl 20 MG TABS, TAKE ONE TABLET BY MOUTH ONE TIME DAILY, Disp: 30 tablet, Rfl: 2   pregabalin (LYRICA) 75 MG capsule, Take 75 mg by mouth daily. , Disp: , Rfl:    rosuvastatin (CRESTOR) 20 MG tablet, Take 1 tablet (20 mg total) by mouth at bedtime. (Patient taking differently: Take 0.5 tablets by mouth at bedtime.), Disp: 90 tablet, Rfl: 3   spironolactone (ALDACTONE) 25 MG tablet, Take 0.5 tablets (12.5 mg total) by mouth every morning., Disp: 15 tablet, Rfl: 2   Radiology:  No results found.  Cardiac Studies:   Echo- 04/24/13 1.Left ventricular cavity is normal in size.  Abnormal septal motion due to IVCD.  Lower limits  systolic global function.   Calculated EF 55%.   Visual EF is 50-55%.   Doppler evidence of Grade I (impaired) diastolic dysfunction. 2.Left atrial cavity is slightly dilated. 3.Mild calcification of the mitral annulus.   Trace mitral regurgitation.   Mitral valve inflow A > E ratio. 4.Tricuspid valve structurally normal.   Mild tricuspid regurgitation.  Lexiscan Myoview stress test 04/25/2013: 1. Resting EKG NSR, LBBB.  Stress EKG was non diagnostic for ischemia. No ST-T changes of ischemia noted with pharmacologic stress testing. Stress symptoms included chest pressure, sob, stomach discomfort and headache. Stress terminated due to completion of protocol. 2. The perfusion study demonstrated mild breast attenuation artifact in the anterior wall. There was no evidence of ischemia or scar. Dynamic gated images reveal normal wall motion and endocardial thickening. Left ventricular ejection fraction was estimated to be 69%.  This represents a low risk study.  Peripheral arteriogram 12/25/2017: Left common iliac artery tandem 99% calcific stenosis S/P 2.0 solid crown CSI orbital atherectomy and Viabahn. Left AT appears occluded distally. Otherwise mild disease bilaterally.  ABI 01/23/2018: Moderately abnormal waveforms of the right ankle. Mildly abnormal waveforms of the left ankle. Mildly decreased right resting ABI. Mildly decreased left resting ABI. Compared to ABI 11/19/2017, normal ABI right, left was 0.7.  Abdominal Aortic Duplex 09/16/2020: The maximum aorta  diameter is 1 cm (prox). Severe plaque observed in the proximal, mid and distal aorta. Severe plaque observed in the right internal iliac and left internal iliac arteries.  Velocity is moderately elevated throughout the abdominal aorta and iliac vessels. No focal stenosis identified.  Clinical correlation recommended. Consider catheter directed angiography to evaluate the abdominal aortic stenosis.  Lower Extremity Arterial Duplex  09/16/2020: No hemodynamically significant stenoses are identified in the bilateral lower extremity arterial system.   This exam reveals moderately decreased perfusion of the right lower extremity, noted at the anterior tibial and post tibial artery level (ABI 0.68) and moderately decreased perfusion of the left lower extremity, noted at the anterior tibial and post tibial artery level (ABI 0.65).  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  Carotid artery duplex  06/21/2021:  Duplex suggests stenosis in the right internal carotid artery (1-15%). Duplex suggests stenosis in the right external carotid artery (<50%).  Duplex suggests stenosis in the left internal carotid artery (16-49%). Duplex suggests stenosis in the left external carotid artery (<50%).  Heterogeneous plaque noted in bilateral carotid arteries.  Antegrade right vertebral artery flow. Antegrade left vertebral artery flow.  Follow up in one year is appropriate if clinically indicated.  EKG   EKG 01/26/2022: Normal sinus rhythm at rate of 61 bpm, left axis deviation, left anterior fascicular block.  IVCD, atypical left bundle branch block.  Nonspecific T abnormality.  No significant change from 06/15/2021.    Assessment     ICD-10-CM   1. Primary hypertension  I10     2. Carotid stenosis, asymptomatic, left  I65.22     3. 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 Type 2 diabetes mellitus with diabetic polyneuropathy, without long-term current use of insulin (HCC)  E11.42 empagliflozin (JARDIANCE) 25 MG TABS tablet     Meds ordered this encounter  Medications   empagliflozin (JARDIANCE) 25 MG TABS tablet    Sig: Take 1 tablet (25 mg total) by mouth daily before breakfast.    Dispense:  30 tablet    Refill:  6   Medications Discontinued During This Encounter  Medication Reason   baclofen (LIORESAL) 10 MG tablet      No orders of the defined  types were placed in this encounter.  Recommendations:   Mrs. Anapaula Severt is a 81 y.o. Asian [place] female with history of chronic ILBBB, hypertension, hyperlipidemia, diabetes mellitus and PAD S/P left iliac artery stent in 2019.   I had seen her 6 weeks ago, I  did increase the dose of Bystolic from 10 mg to 20 mg daily which she is tolerating.  Because of mild hyperkalemia with potassium of 5.3, will discontinue spironolactone, also in view of uncontrolled diabetes mellitus, I given her prescription and also samples for Jardiance and advised her to follow-up with her PCP for further management.  We could try patient assistance in case expends becomes an issue.  Also suspect with the introduction of Jardiance, her blood pressure may also improve significantly as well.  Also in view of peripheral arterial disease and would like her to use Jardiance.  She has developed diarrhea which has been frequent for the past almost a month, advised her to hold off on taking metformin and see if her symptoms improve.  Beta-blockers can induce diarrhea, whether Bystolic is causing her diarrhea cannot be excluded.  ARB's can also cause diarrhea.   Otherwise she is stable from cardiac standpoint, blood pressure significantly improved although still not at goal, would prefer her to have blood pressure of 130/75 to 80 mmHg.  I will see her back in 6 months.    Yates Decamp, MD, [facility] 04/17/2022, 4:12 PM Office: [phone] Pager: [phone]

## 2022-04-21 DIAGNOSIS — E1169 Type 2 diabetes mellitus with other specified complication: Secondary | ICD-10-CM | POA: Diagnosis not present

## 2022-04-28 DIAGNOSIS — Z23 Encounter for immunization: Secondary | ICD-10-CM | POA: Diagnosis not present

## 2022-04-28 DIAGNOSIS — R64 Cachexia: Secondary | ICD-10-CM | POA: Diagnosis not present

## 2022-04-28 DIAGNOSIS — Z299 Encounter for prophylactic measures, unspecified: Secondary | ICD-10-CM | POA: Diagnosis not present

## 2022-04-28 DIAGNOSIS — E1165 Type 2 diabetes mellitus with hyperglycemia: Secondary | ICD-10-CM | POA: Diagnosis not present

## 2022-04-28 DIAGNOSIS — I10 Essential (primary) hypertension: Secondary | ICD-10-CM | POA: Diagnosis not present

## 2022-05-08 DIAGNOSIS — Z23 Encounter for immunization: Secondary | ICD-10-CM | POA: Diagnosis not present

## 2022-05-22 DIAGNOSIS — E1169 Type 2 diabetes mellitus with other specified complication: Secondary | ICD-10-CM | POA: Diagnosis not present

## 2022-07-18 DIAGNOSIS — J069 Acute upper respiratory infection, unspecified: Secondary | ICD-10-CM | POA: Diagnosis not present

## 2022-07-18 DIAGNOSIS — R059 Cough, unspecified: Secondary | ICD-10-CM | POA: Diagnosis not present

## 2022-07-19 ENCOUNTER — Other Ambulatory Visit: Payer: Self-pay

## 2022-07-23 ENCOUNTER — Other Ambulatory Visit: Payer: Self-pay | Admitting: Cardiology

## 2022-07-23 DIAGNOSIS — E782 Mixed hyperlipidemia: Secondary | ICD-10-CM

## 2022-07-27 ENCOUNTER — Other Ambulatory Visit: Payer: Medicare Other

## 2022-07-28 ENCOUNTER — Other Ambulatory Visit: Payer: Self-pay | Admitting: Cardiology

## 2022-07-28 DIAGNOSIS — I10 Essential (primary) hypertension: Secondary | ICD-10-CM

## 2022-08-01 DIAGNOSIS — N390 Urinary tract infection, site not specified: Secondary | ICD-10-CM | POA: Diagnosis not present

## 2022-08-01 DIAGNOSIS — R5383 Other fatigue: Secondary | ICD-10-CM | POA: Diagnosis not present

## 2022-08-07 DIAGNOSIS — E1165 Type 2 diabetes mellitus with hyperglycemia: Secondary | ICD-10-CM | POA: Diagnosis not present

## 2022-08-07 DIAGNOSIS — N760 Acute vaginitis: Secondary | ICD-10-CM | POA: Diagnosis not present

## 2022-08-07 DIAGNOSIS — G9519 Other vascular myelopathies: Secondary | ICD-10-CM | POA: Diagnosis not present

## 2022-08-07 DIAGNOSIS — R569 Unspecified convulsions: Secondary | ICD-10-CM | POA: Diagnosis not present

## 2022-08-18 DIAGNOSIS — E1140 Type 2 diabetes mellitus with diabetic neuropathy, unspecified: Secondary | ICD-10-CM | POA: Diagnosis not present

## 2022-08-18 DIAGNOSIS — Z299 Encounter for prophylactic measures, unspecified: Secondary | ICD-10-CM | POA: Diagnosis not present

## 2022-08-18 DIAGNOSIS — R309 Painful micturition, unspecified: Secondary | ICD-10-CM | POA: Diagnosis not present

## 2022-08-18 DIAGNOSIS — I10 Essential (primary) hypertension: Secondary | ICD-10-CM | POA: Diagnosis not present

## 2022-08-18 DIAGNOSIS — N390 Urinary tract infection, site not specified: Secondary | ICD-10-CM | POA: Diagnosis not present

## 2022-08-21 DIAGNOSIS — E1169 Type 2 diabetes mellitus with other specified complication: Secondary | ICD-10-CM | POA: Diagnosis not present

## 2022-08-29 ENCOUNTER — Other Ambulatory Visit: Payer: Medicare Other

## 2022-08-30 ENCOUNTER — Ambulatory Visit: Payer: Medicare Other

## 2022-08-30 DIAGNOSIS — I6522 Occlusion and stenosis of left carotid artery: Secondary | ICD-10-CM

## 2022-09-01 DIAGNOSIS — K219 Gastro-esophageal reflux disease without esophagitis: Secondary | ICD-10-CM | POA: Diagnosis not present

## 2022-09-01 DIAGNOSIS — Z299 Encounter for prophylactic measures, unspecified: Secondary | ICD-10-CM | POA: Diagnosis not present

## 2022-09-01 DIAGNOSIS — E1165 Type 2 diabetes mellitus with hyperglycemia: Secondary | ICD-10-CM | POA: Diagnosis not present

## 2022-09-01 DIAGNOSIS — R059 Cough, unspecified: Secondary | ICD-10-CM | POA: Diagnosis not present

## 2022-09-01 DIAGNOSIS — I10 Essential (primary) hypertension: Secondary | ICD-10-CM | POA: Diagnosis not present

## 2022-09-04 NOTE — Progress Notes (Signed)
Let them know that the carotid shows minimal disease and no further studies needed

## 2022-09-05 NOTE — Progress Notes (Signed)
Called and spoke with patient regarding her CAD results.

## 2022-09-20 DIAGNOSIS — E1169 Type 2 diabetes mellitus with other specified complication: Secondary | ICD-10-CM | POA: Diagnosis not present

## 2022-10-16 ENCOUNTER — Encounter: Payer: Self-pay | Admitting: Cardiology

## 2022-10-16 ENCOUNTER — Ambulatory Visit: Payer: Medicare Other | Admitting: Cardiology

## 2022-10-16 VITALS — BP 146/59 | HR 82 | Ht <= 58 in | Wt 108.0 lb

## 2022-10-16 DIAGNOSIS — E1142 Type 2 diabetes mellitus with diabetic polyneuropathy: Secondary | ICD-10-CM

## 2022-10-16 DIAGNOSIS — I739 Peripheral vascular disease, unspecified: Secondary | ICD-10-CM | POA: Diagnosis not present

## 2022-10-16 DIAGNOSIS — I6522 Occlusion and stenosis of left carotid artery: Secondary | ICD-10-CM | POA: Diagnosis not present

## 2022-10-16 DIAGNOSIS — E782 Mixed hyperlipidemia: Secondary | ICD-10-CM

## 2022-10-16 DIAGNOSIS — I10 Essential (primary) hypertension: Secondary | ICD-10-CM

## 2022-10-16 MED ORDER — HYDRALAZINE HCL 50 MG PO TABS: 50.0000 mg | ORAL_TABLET | Freq: Three times a day (TID) | ORAL | 3 refills | Status: DC

## 2022-10-16 NOTE — Progress Notes (Unsigned)
Primary Physician/Referring:  Glenda Chroman, MD  Patient ID: Brianna Hendricks, female    DOB: 08/05/1940, 82 y.o.   MRN: [id number]  Chief Complaint  Patient presents with   PAD   Hypertension   Follow-up    6 month    HPI:    HPI: Brianna Hendricks  is a 82 y.o. Asian [place] female with history of chronic LBBB, hypertension, hyperlipidemia, diabetes mellitus and PAD S/P left iliac artery stent in 2019.  She presents here for a 40-month follow-up.  States that she is now again developing cramping in her legs and also cramping at night.  Otherwise no chest pain, dyspnea, palpitations, dizziness or syncope.  She has been compliant with all her medications. Past Medical History:  Diagnosis Date   Arthritis    "some in my" 4th digit right hand  (12/25/2017)   High cholesterol    History of blood transfusion    "related to female bleeding"   Hypertension    PAD (peripheral artery disease) ([place])    Stroke ([place]) 1998   "fully recovered" (12/25/2017)   Type II diabetes mellitus ([place])    Past Surgical History:  Procedure Laterality Date   ABDOMINAL AORTOGRAM N/A 12/25/2017   Procedure: ABDOMINAL AORTOGRAM;  Surgeon: Adrian Prows, MD;  Location: [place] [facility];  Service: Cardiovascular;  Laterality: N/A;   CATARACT EXTRACTION W/ INTRAOCULAR LENS  IMPLANT, BILATERAL Bilateral    CHOLECYSTECTOMY     LOWER EXTREMITY ANGIOGRAPHY Bilateral 12/25/2017   Procedure: LOWER EXTREMITY ANGIOGRAPHY;  Surgeon: Adrian Prows, MD;  Location: [place] [facility];  Service: Cardiovascular;  Laterality: Bilateral;   PERIPHERAL VASCULAR ATHERECTOMY Left 12/25/2017   Procedure: PERIPHERAL VASCULAR ATHERECTOMY;  Surgeon: Adrian Prows, MD;  Location: [place] [facility];  Service: Cardiovascular;  Laterality: Left;  common iliac   PERIPHERAL VASCULAR INTERVENTION Left 12/25/2017   Procedure: PERIPHERAL VASCULAR INTERVENTION;  Surgeon: Adrian Prows, MD;  Location: [place] [facility];  Service:  Cardiovascular;  Laterality: Left;  common iliac   THYROID SURGERY  1972   " not sure what was done:"   TONSILLECTOMY     Family History  Problem Relation Age of Onset   Heart attack Father    Social History   Tobacco Use   Smoking status: Never   Smokeless tobacco: Never  Substance Use Topics   Alcohol use: Never  Marital Status: Married    ROS  Review of Systems  Cardiovascular:  Positive for claudication. Negative for chest pain, dyspnea on exertion and leg swelling.  Neurological:  Positive for paresthesias (feet and hands).   Objective  Blood pressure (!) 146/59, pulse 82, height 4\' 10"  (1.473 m), weight 108 lb (49 kg), SpO2 99 %. Body mass index is 22.57 kg/m.      10/16/2022    2:44 PM 10/16/2022    2:24 PM 04/17/2022    3:35 PM  Vitals with BMI  Height  4\' 10"    Weight  108 lbs   BMI  [id number]   Systolic [id number]  Diastolic 59 54 56  Pulse  82 61      Physical Exam Neck:     Vascular: Carotid bruit (bilateral) present. No JVD.  Cardiovascular:     Rate and Rhythm: Normal rate and regular rhythm.     Pulses:          Femoral pulses are 1+ on the right side and 2+ on the left side.  Popliteal pulses are 0 on the right side and 0 on the left side.       Dorsalis pedis pulses are 1+ on the right side and 0 on the left side.       Posterior tibial pulses are 0 on the left side.     Heart sounds: Normal heart sounds. No murmur heard.    No gallop.  Pulmonary:     Effort: Pulmonary effort is normal.     Breath sounds: Normal breath sounds.  Abdominal:     General: Bowel sounds are normal. There is abdominal bruit.     Palpations: Abdomen is soft.  Musculoskeletal:     Right lower leg: No edema.     Left lower leg: No edema.  Skin:    Capillary Refill: Capillary refill takes less than 2 seconds.    Laboratory examination:   External Labs:  Cholesterol, total 140.000 m 02/20/2022 HDL 63.000 mg 02/20/2022 LDL 47.000 mg 02/20/2022 Triglycerides  186.000 m 02/20/2022  A1C 8.100 % 09/01/2022 TSH 3.300 02/20/2022  Hemoglobin 10.400 g/d 02/20/2022 Platelets 210.000 x1 02/20/2022  Creatinine, Serum 0.880 mg/ 02/20/2022 Potassium 5.500 mm 02/20/2022 ALT (SGPT) 26.000 IU/ 02/20/2022   Allergies  No Known Allergies   Final Medications at End of Visit    Current Outpatient Medications:    amLODipine (NORVASC) 10 MG tablet, TAKE ONE TABLET BY MOUTH ONE TIME DAILY, Disp: 90 tablet, Rfl: 3   aspirin EC 81 MG tablet, Take 81 mg by mouth daily., Disp: , Rfl:    carbamazepine (TEGRETOL) 200 MG tablet, Take 100 mg by mouth 2 (two) times daily., Disp: , Rfl:    cyanocobalamin (VITAMIN B12) 1000 MCG tablet, Take 1,000 mcg by mouth daily., Disp: , Rfl:    folic acid (FOLVITE) 1 MG tablet, Take 1 mg by mouth every evening., Disp: , Rfl:    glipiZIDE (GLUCOTROL) 5 MG tablet, Take by mouth 2 (two) times daily., Disp: , Rfl:    hydrALAZINE (APRESOLINE) 50 MG tablet, Take 1 tablet (50 mg total) by mouth 3 (three) times daily., Disp: 270 tablet, Rfl: 3   losartan (COZAAR) 100 MG tablet, Take 50 mg by mouth daily., Disp: , Rfl:    metFORMIN (GLUCOPHAGE) 500 MG tablet, Take 1,000 mg by mouth 2 (two) times daily with a meal., Disp: , Rfl:    Nebivolol HCl 20 MG TABS, Take 1 tablet (20 mg total) by mouth daily., Disp: 90 tablet, Rfl: 3   pantoprazole (PROTONIX) 40 MG tablet, Take 40 mg by mouth daily., Disp: , Rfl:    rosuvastatin (CRESTOR) 20 MG tablet, TAKE 1 TABLET BY MOUTH AT BEDTIME, Disp: 90 tablet, Rfl: 0   empagliflozin (JARDIANCE) 25 MG TABS tablet, Take 1 tablet (25 mg total) by mouth daily before breakfast. (Patient not taking: Reported on 10/16/2022), Disp: 30 tablet, Rfl: 6   Radiology:  No results found.  Cardiac Studies:   Echo- 04/24/13 1.Left ventricular cavity is normal in size.  Abnormal septal motion due to IVCD.  Lower limits systolic global function.   Calculated EF 55%.   Visual EF is 50-55%.   Doppler evidence of Grade I (impaired)  diastolic dysfunction. 2.Left atrial cavity is slightly dilated. 3.Mild calcification of the mitral annulus.   Trace mitral regurgitation.   Mitral valve inflow A > E ratio. 4.Tricuspid valve structurally normal.   Mild tricuspid regurgitation.  Lexiscan Myoview stress test 04/25/2013: 1. Resting EKG NSR, LBBB. Stress EKG was non diagnostic for ischemia. No ST-T  changes of ischemia noted with pharmacologic stress testing. Stress symptoms included chest pressure, sob, stomach discomfort and headache. Stress terminated due to completion of protocol. 2. The perfusion study demonstrated mild breast attenuation artifact in the anterior wall. There was no evidence of ischemia or scar. Dynamic gated images reveal normal wall motion and endocardial thickening. Left ventricular ejection fraction was estimated to be 69%.  This represents a low risk study.  Peripheral arteriogram 12/25/2017: Left common iliac artery tandem 99% calcific stenosis S/P 2.0 solid crown CSI orbital atherectomy and Viabahn. Left AT appears occluded distally. Otherwise mild disease bilaterally.  ABI 01/23/2018: Moderately abnormal waveforms of the right ankle. Mildly abnormal waveforms of the left ankle. Mildly decreased right resting ABI. Mildly decreased left resting ABI. Compared to ABI 11/19/2017, normal ABI right, left was 0.7.  Abdominal Aortic Duplex 09/16/2020: The maximum aorta  diameter is 1 cm (prox). Severe plaque observed in the proximal, mid and distal aorta. Severe plaque observed in the right internal iliac and left internal iliac arteries.  Velocity is moderately elevated throughout the abdominal aorta and iliac vessels. No focal stenosis identified.  Clinical correlation recommended. Consider catheter directed angiography to evaluate the abdominal aortic stenosis.  Lower Extremity Arterial Duplex 09/16/2020: No hemodynamically significant stenoses are identified in the bilateral lower extremity arterial system.    This exam reveals moderately decreased perfusion of the right lower extremity, noted at the anterior tibial and post tibial artery level (ABI 0.68) and moderately decreased perfusion of the left lower extremity, noted at the anterior tibial and post tibial artery level (ABI 0.65).  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  Carotid artery duplex 08/30/2022: Duplex suggests stenosis in the right internal carotid artery (1-15%). Duplex suggests stenosis in the left internal carotid artery (1-15%). Antegrade right vertebral artery flow. Compared to 06/21/2021, left ICA stenosis of 15-49% has regressed. Further studies if clinically recommended.  EKG   EKG 10/16/2022: Normal sinus rhythm at the rate of 79 bpm, IVCD, atypical LBBB.  No significant change from 01/26/2022.  Assessment     ICD-10-CM   1. PAD (peripheral artery disease) (HCC)  I73.9 EKG 12-Lead    PCV LOWER ARTERIAL (BILATERAL)    PCV AORTA DUPLEX    PCV ANKLE BRACHIAL INDEX (ABI)    2. Primary hypertension  I10 hydrALAZINE (APRESOLINE) 50 MG tablet    3. Carotid stenosis, asymptomatic, left  I65.22     4. Mixed hyperlipidemia  E78.2     5. Claudication in peripheral vascular disease (HCC)  I73.9     6. Type 2 diabetes mellitus with diabetic polyneuropathy, without long-term current use of insulin (HCC)  E11.42      Meds ordered this encounter  Medications   hydrALAZINE (APRESOLINE) 50 MG tablet    Sig: Take 1 tablet (50 mg total) by mouth 3 (three) times daily.    Dispense:  270 tablet    Refill:  3   Medications Discontinued During This Encounter  Medication Reason   pregabalin (LYRICA) 75 MG capsule Completed Course     Orders Placed This Encounter  Procedures   EKG 12-Lead    Recommendations:   Brianna Hendricks is a 82 y.o. [place] female with history of chronic LBBB, hypertension, hyperlipidemia, diabetes mellitus and PAD S/P left iliac artery stent in 2019.   1. PAD (peripheral  artery disease) ([place]) Patient is now having recurrence of symptoms of claudication and also cramping at night.  She is abnormal physical exam with  absent pedal pulses and reduced right femoral pulse as well.  She will need abdominal attic duplex for prominent abdominal bruit and also to exclude recurrence of iliac artery stenosis bilaterally, has a history of left iliac artery stenting in the past.  On prior angiography, she was found to have severe atherosclerotic changes in the abdominal aorta.  She may also be developing abdominal aortic stenosis. - EKG 12-Lead - PCV LOWER ARTERIAL (BILATERAL); Future - PCV AORTA DUPLEX; Future - PCV ANKLE BRACHIAL INDEX (ABI); Future  2. Primary hypertension Blood pressure is elevated today, I have added hydralazine 50 mg 3 times daily. - hydrALAZINE (APRESOLINE) 50 MG tablet; Take 1 tablet (50 mg total) by mouth 3 (three) times daily.  Dispense: 270 tablet; Refill: 3  3. Carotid stenosis, asymptomatic, left I reviewed her carotid artery duplex, she had mild carotid stenosis in the past especially left now appears to be improved on statin therapy.  No further surveillance is indicated.  4. Mixed hyperlipidemia I reviewed her lipids, lipids under excellent control.  She is presently on high intensity statin, continue same.  I would like to see her back in 6 weeks for follow-up.     Adrian Prows, MD, [facility] 10/16/2022, 3:03 PM Office: [phone] Pager: [phone]

## 2022-10-21 ENCOUNTER — Other Ambulatory Visit: Payer: Self-pay | Admitting: Cardiology

## 2022-10-21 DIAGNOSIS — E782 Mixed hyperlipidemia: Secondary | ICD-10-CM

## 2022-10-21 DIAGNOSIS — E1169 Type 2 diabetes mellitus with other specified complication: Secondary | ICD-10-CM | POA: Diagnosis not present

## 2022-10-23 DIAGNOSIS — R059 Cough, unspecified: Secondary | ICD-10-CM | POA: Diagnosis not present

## 2022-11-14 ENCOUNTER — Other Ambulatory Visit: Payer: Medicare Other

## 2022-11-21 DIAGNOSIS — E119 Type 2 diabetes mellitus without complications: Secondary | ICD-10-CM | POA: Diagnosis not present

## 2022-11-21 DIAGNOSIS — I10 Essential (primary) hypertension: Secondary | ICD-10-CM | POA: Diagnosis not present

## 2022-11-21 DIAGNOSIS — E1169 Type 2 diabetes mellitus with other specified complication: Secondary | ICD-10-CM | POA: Diagnosis not present

## 2022-11-29 ENCOUNTER — Ambulatory Visit: Payer: Medicare Other | Admitting: Cardiology

## 2022-12-05 ENCOUNTER — Ambulatory Visit: Payer: Medicare Other

## 2022-12-05 DIAGNOSIS — I739 Peripheral vascular disease, unspecified: Secondary | ICD-10-CM | POA: Diagnosis not present

## 2022-12-12 ENCOUNTER — Ambulatory Visit: Payer: Medicare Other

## 2022-12-12 DIAGNOSIS — I739 Peripheral vascular disease, unspecified: Secondary | ICD-10-CM | POA: Diagnosis not present

## 2022-12-12 DIAGNOSIS — R0989 Other specified symptoms and signs involving the circulatory and respiratory systems: Secondary | ICD-10-CM | POA: Diagnosis not present

## 2022-12-18 NOTE — Progress Notes (Signed)
Abdominal Aortic Duplex 12/12/2022: Mild plaque noted in the mid aorta.  Mild increase in the abdominal aortic velocity without hemodynamically significant stenosis. Normal multiphasic waveform in the bilateral CIA, mild elevation in the left CIA velocity without significant stenosis.  No significant change fro 09/16/2020.   I will discuss on visit soon

## 2022-12-19 ENCOUNTER — Encounter: Payer: Self-pay | Admitting: Cardiology

## 2022-12-19 ENCOUNTER — Ambulatory Visit: Payer: Medicare Other | Admitting: Cardiology

## 2022-12-19 VITALS — BP 130/54 | HR 86 | Ht <= 58 in | Wt 107.0 lb

## 2022-12-19 DIAGNOSIS — I10 Essential (primary) hypertension: Secondary | ICD-10-CM | POA: Diagnosis not present

## 2022-12-19 DIAGNOSIS — I739 Peripheral vascular disease, unspecified: Secondary | ICD-10-CM

## 2022-12-19 DIAGNOSIS — E1165 Type 2 diabetes mellitus with hyperglycemia: Secondary | ICD-10-CM | POA: Diagnosis not present

## 2022-12-19 DIAGNOSIS — R2689 Other abnormalities of gait and mobility: Secondary | ICD-10-CM | POA: Diagnosis not present

## 2022-12-19 DIAGNOSIS — E1140 Type 2 diabetes mellitus with diabetic neuropathy, unspecified: Secondary | ICD-10-CM | POA: Diagnosis not present

## 2022-12-19 NOTE — Progress Notes (Signed)
Primary Physician/Referring:  Ignatius Specking, MD  Patient ID: Brianna Hendricks, female    DOB: [birth year], 82 y.o.   MRN: [id number]  Chief Complaint  Patient presents with   PAD (peripheral artery disease) (HCC)   Primary hypertension   Follow-up    HPI:    HPI: Brianna Hendricks  is a 82 y.o. Asian [place] female with history of chronic LBBB, hypertension, hyperlipidemia, diabetes mellitus and PAD S/P left iliac artery stent in 2019.  States that she is now again developing cramping in her legs and also cramping at night.  Otherwise no chest pain, dyspnea, palpitations, dizziness or syncope.  She has been compliant with all her medications.  She presents for markedly reduced exercise tolerance, states that even doing minimal activity her legs gave up and she has to stop.  Both her legs and bilateral hips hurt you are doing minimal activity.  She underwent lower extremity arterial duplex and abdominal aortic duplex and presents for follow-up.  Her husband is present.  Past Medical History:  Diagnosis Date   Arthritis    "some in my" 4th digit right hand  (12/25/2017)   High cholesterol    History of blood transfusion    "related to female bleeding"   Hypertension    PAD (peripheral artery disease) (HCC)    Stroke (HCC) 1998   "fully recovered" (12/25/2017)   Type II diabetes mellitus (HCC)    Past Surgical History:  Procedure Laterality Date   ABDOMINAL AORTOGRAM N/A 12/25/2017   Procedure: ABDOMINAL AORTOGRAM;  Surgeon: Yates Decamp, MD;  Location: [facility];  Service: Cardiovascular;  Laterality: N/A;   CATARACT EXTRACTION W/ INTRAOCULAR LENS  IMPLANT, BILATERAL Bilateral    CHOLECYSTECTOMY     LOWER EXTREMITY ANGIOGRAPHY Bilateral 12/25/2017   Procedure: LOWER EXTREMITY ANGIOGRAPHY;  Surgeon: Yates Decamp, MD;  Location: [facility];  Service: Cardiovascular;  Laterality: Bilateral;   PERIPHERAL VASCULAR ATHERECTOMY Left 12/25/2017   Procedure:  PERIPHERAL VASCULAR ATHERECTOMY;  Surgeon: Yates Decamp, MD;  Location: [facility] IN[facility] CV [facility];  Service: Cardiovascular;  Laterality: Left;  common iliac   PERIPHERAL VASCULAR INTERVENTION Left 12/25/2017   Procedure: PERIPHERAL VASCULAR INTERVENTION;  Surgeon: Yates Decamp, MD;  Location: [facility];  Service: Cardiovascular;  Laterality: Left;  common iliac   THYROID SURGERY  1972   " not sure what was done:"   TONSILLECTOMY     Family History  Problem Relation Age of Onset   Heart attack Father    Social History   Tobacco Use   Smoking status: Never   Smokeless tobacco: Never  Substance Use Topics   Alcohol use: Never  Marital Status: Married    ROS  Review of Systems  Cardiovascular:  Positive for claudication. Negative for chest pain, dyspnea on exertion and leg swelling.  Neurological:  Positive for paresthesias (feet and hands).   Objective  Blood pressure (!) 130/54, pulse 86, height 4\' 10"  (1.473 m), weight 107 lb (48.5 kg), SpO2 98 %. Body mass index is 22.36 kg/m.      12/19/2022    2:14 PM 10/16/2022    2:44 PM 10/16/2022    2:24 PM  Vitals with BMI  Height 4\' 10"   4\' 10"   Weight 107 lbs  108 lbs  BMI 22.37  22.58  Systolic 130 146 409  Diastolic 54 59 54  Pulse 86  82      Physical Exam Neck:     Vascular: Carotid  bruit (bilateral) present. No JVD.  Cardiovascular:     Rate and Rhythm: Normal rate and regular rhythm.     Pulses:          Femoral pulses are 1+ on the right side and 2+ on the left side.      Popliteal pulses are 0 on the right side and 0 on the left side.       Dorsalis pedis pulses are 1+ on the right side and 0 on the left side.       Posterior tibial pulses are 0 on the left side.     Heart sounds: Normal heart sounds. No murmur heard.    No gallop.  Pulmonary:     Effort: Pulmonary effort is normal.     Breath sounds: Normal breath sounds.  Abdominal:     General: Bowel sounds are normal. There is abdominal bruit.     Palpations:  Abdomen is soft.  Musculoskeletal:     Right lower leg: No edema.     Left lower leg: No edema.  Skin:    Capillary Refill: Capillary refill takes less than 2 seconds.    Laboratory examination:   External Labs:  Cholesterol, total 140.000 m 02/20/2022 HDL 63.000 mg 02/20/2022 LDL 47.000 mg 02/20/2022 Triglycerides 186.000 m 02/20/2022  A1C 8.100 % 09/01/2022 TSH 3.300 02/20/2022  Hemoglobin 10.400 g/d 02/20/2022 Platelets 210.000 x1 02/20/2022  Creatinine, Serum 0.880 mg/ 02/20/2022 Potassium 5.500 mm 02/20/2022 ALT (SGPT) 26.000 IU/ 02/20/2022   Allergies   Allergies  Allergen Reactions   Jardiance [Empagliflozin] Other (See Comments)    Frequency of urination     Final Medications at End of Visit    Current Outpatient Medications:    amLODipine (NORVASC) 10 MG tablet, TAKE ONE TABLET BY MOUTH ONE TIME DAILY, Disp: 90 tablet, Rfl: 3   aspirin EC 81 MG tablet, Take 81 mg by mouth daily., Disp: , Rfl:    cyanocobalamin (VITAMIN B12) 1000 MCG tablet, Take 1,000 mcg by mouth daily., Disp: , Rfl:    folic acid (FOLVITE) 1 MG tablet, Take 1 mg by mouth every evening., Disp: , Rfl:    glipiZIDE (GLUCOTROL) 5 MG tablet, Take by mouth 2 (two) times daily., Disp: , Rfl:    hydrALAZINE (APRESOLINE) 50 MG tablet, Take 1 tablet (50 mg total) by mouth 3 (three) times daily., Disp: 270 tablet, Rfl: 3   losartan (COZAAR) 100 MG tablet, Take 50 mg by mouth daily., Disp: , Rfl:    metFORMIN (GLUCOPHAGE) 500 MG tablet, Take 1,000 mg by mouth 2 (two) times daily with a meal., Disp: , Rfl:    Nebivolol HCl 20 MG TABS, Take 1 tablet (20 mg total) by mouth daily., Disp: 90 tablet, Rfl: 3   pantoprazole (PROTONIX) 40 MG tablet, Take 40 mg by mouth daily., Disp: , Rfl:    pregabalin (LYRICA) 25 MG capsule, Take 25 mg by mouth 2 (two) times daily., Disp: , Rfl:    rosuvastatin (CRESTOR) 20 MG tablet, TAKE 1 TABLET BY MOUTH AT BEDTIME, Disp: 90 tablet, Rfl: 0   carbamazepine (TEGRETOL) 200 MG tablet,  Take 100 mg by mouth 2 (two) times daily., Disp: , Rfl:    Radiology:  No results found.  Cardiac Studies:   Echo- 04/24/13 1.Left ventricular cavity is normal in size.  Abnormal septal motion due to IVCD.  Lower limits systolic global function.   Calculated EF 55%.   Visual EF is 50-55%.   Doppler evidence of Grade  I (impaired) diastolic dysfunction. 2.Left atrial cavity is slightly dilated. 3.Mild calcification of the mitral annulus.   Trace mitral regurgitation.   Mitral valve inflow A > E ratio. 4.Tricuspid valve structurally normal.   Mild tricuspid regurgitation.  Lexiscan Myoview stress test 04/25/2013: 1. Resting EKG NSR, LBBB. Stress EKG was non diagnostic for ischemia. No ST-T changes of ischemia noted with pharmacologic stress testing. Stress symptoms included chest pressure, sob, stomach discomfort and headache. Stress terminated due to completion of protocol. 2. The perfusion study demonstrated mild breast attenuation artifact in the anterior wall. There was no evidence of ischemia or scar. Dynamic gated images reveal normal wall motion and endocardial thickening. Left ventricular ejection fraction was estimated to be 69%.  This represents a low risk study.  Peripheral arteriogram 12/25/2017: Left common iliac artery tandem 99% calcific stenosis S/P 2.0 solid crown CSI orbital atherectomy and Viabahn. Left AT appears occluded distally. Otherwise mild disease bilaterally.  Carotid artery duplex 08/30/2022: Duplex suggests stenosis in the right internal carotid artery (1-15%). Duplex suggests stenosis in the left internal carotid artery (1-15%). Antegrade right vertebral artery flow. Compared to 06/21/2021, left ICA stenosis of 15-49% has regressed. Further studies if clinically recommended.  Abdominal Aortic Duplex 12/12/2022: Mild plaque noted in the mid aorta.  Mild increase in the abdominal aortic velocity without hemodynamically significant stenosis. Normal multiphasic waveform  in the bilateral CIA, mild elevation in the left CIA velocity without significant stenosis.  No significant change fro 09/16/2020.  Lower Extremity Arterial Duplex 12/05/2022:  Dampened monophasic waveform in the right EIA and CFA may suggest  hemodynamically significant proximal (CIA) stenosis.  No hemodynamically significant stenoses are identified in the left lower  extremity arterial system.  This exam reveals mildly decreased perfusion of the right lower extremity,  noted at the dorsalis pedis artery level (ABI 0.95).   Non-compressible ABI on the left, suggestive of medial calcinosis.  Compared to the study done on 09/16/2020, right ABI 0.68 and left ABI 0.65.   Multiphasic waveform was noted in the right and right CFA.  Present study  also suggests patent left common iliac artery stent   EKG   EKG 10/16/2022: Normal sinus rhythm at the rate of 79 bpm, IVCD, atypical LBBB.  No significant change from 01/26/2022.  Assessment     ICD-10-CM   1. PAD (peripheral artery disease) (HCC)  I73.9     2. Primary hypertension  I10      No orders of the defined types were placed in this encounter.  Medications Discontinued During This Encounter  Medication Reason   empagliflozin (JARDIANCE) 25 MG TABS tablet      No orders of the defined types were placed in this encounter.   Recommendations:   Mrs. Xiclali Erdely is a 82 y.o. Asian [place] female with history of chronic LBBB, hypertension, hyperlipidemia, diabetes mellitus and PAD S/P left iliac artery stent in 2019.  She presents for follow-up of claudication, underwent abdominal aortic duplex of lower extremity arterial Dopplers.  1. PAD (peripheral artery disease) (HCC) Patient is now having recurrence of symptoms of claudication and also cramping at night.  She is abnormal physical exam with absent pedal pulses and reduced right femoral pulse as well. On prior angiography, she was found to have severe atherosclerotic changes in  the abdominal aorta.  She may also be developing abdominal aortic stenosis.  Best option is to proceed with peripheral arteriogram to evaluate her abdominal aorta.  Discrepancy between abdominal aortic duplex and lower extremity  arterial Dopplers suggesting monophasic waveform pattern at the iliac levels suggesting proximal occlusion and/or stenosis. She is extremely symptomatic.  Question is whether her symptoms are related to severe PAD versus diabetic myopathy.  Diabetes continues to be uncontrolled.  We could certainly look into GLP-1 agonist to see if diabetes could be controlled better.   Will schedule for peripheral arteriogram and possible angioplasty given symptoms. Patient understands the risks, benefits, alternatives including medical therapy, CT angiography. Patient understands <1-2% risk of death, embolic complications, bleeding, infection, renal failure, urgent surgical revascularization, but not limited to these and wants to proceed.  I will request her PCP to perform BMP and CBC for preprocedure labs.   2. Primary hypertension Patient is tolerating hydralazine, excellent control of blood pressure, continue the same.  I would like to see her back in 6 weeks for follow-up.     Yates Decamp, MD, [facility] 12/19/2022, 3:02 PM Office: [phone] Pager: [phone]

## 2022-12-19 NOTE — H&P (View-Only) (Signed)
 Primary Physician/Referring:  Vyas, Dhruv B, MD  Patient ID: Brianna Hendricks, female    DOB: 05/24/1941, 82 y.o.   MRN: [id number]  Chief Complaint  Patient presents with   PAD (peripheral artery disease) (HCC)   Primary hypertension   Follow-up    HPI:    HPI: Brianna Hendricks  is a 82 y.o. [place] female with history of chronic LBBB, hypertension, hyperlipidemia, diabetes mellitus and PAD S/P left iliac artery stent in 2019.  States that she is now again developing cramping in her legs and also cramping at night.  Otherwise no chest pain, dyspnea, palpitations, dizziness or syncope.  She has been compliant with all her medications.  She presents for markedly reduced exercise tolerance, states that even doing minimal activity her legs gave up and she has to stop.  Both her legs and bilateral hips hurt you are doing minimal activity.  She underwent lower extremity arterial duplex and abdominal aortic duplex and presents for follow-up.  Her husband is present.  Past Medical History:  Diagnosis Date   Arthritis    "some in my" 4th digit right hand  (12/25/2017)   High cholesterol    History of blood transfusion    "related to female bleeding"   Hypertension    PAD (peripheral artery disease) (HCC)    Stroke (HCC) 1998   "fully recovered" (12/25/2017)   Type II diabetes mellitus (HCC)    Past Surgical History:  Procedure Laterality Date   ABDOMINAL AORTOGRAM N/A 12/25/2017   Procedure: ABDOMINAL AORTOGRAM;  Surgeon: Pallavi Clifton, MD;  Location: [facility];  Service: Cardiovascular;  Laterality: N/A;   CATARACT EXTRACTION W/ INTRAOCULAR LENS  IMPLANT, BILATERAL Bilateral    CHOLECYSTECTOMY     LOWER EXTREMITY ANGIOGRAPHY Bilateral 12/25/2017   Procedure: LOWER EXTREMITY ANGIOGRAPHY;  Surgeon: Winfield Caba, MD;  Location: [facility];  Service: Cardiovascular;  Laterality: Bilateral;   PERIPHERAL VASCULAR ATHERECTOMY Left 12/25/2017   Procedure:  PERIPHERAL VASCULAR ATHERECTOMY;  Surgeon: Eliya Bubar, MD;  Location: [facility];  Service: Cardiovascular;  Laterality: Left;  common iliac   PERIPHERAL VASCULAR INTERVENTION Left 12/25/2017   Procedure: PERIPHERAL VASCULAR INTERVENTION;  Surgeon: Darneisha Windhorst, MD;  Location: [facility];  Service: Cardiovascular;  Laterality: Left;  common iliac   THYROID SURGERY  1972   " not sure what was done:"   TONSILLECTOMY     Family History  Problem Relation Age of Onset   Heart attack Father    Social History   Tobacco Use   Smoking status: Never   Smokeless tobacco: Never  Substance Use Topics   Alcohol use: Never  Marital Status: Married    ROS  Review of Systems  Cardiovascular:  Positive for claudication. Negative for chest pain, dyspnea on exertion and leg swelling.  Neurological:  Positive for paresthesias (feet and hands).   Objective  Blood pressure (!) 130/54, pulse 86, height 4' 10" (1.473 m), weight 107 lb (48.5 kg), SpO2 98 %. Body mass index is 22.36 kg/m.      12/19/2022    2:14 PM 10/16/2022    2:44 PM 10/16/2022    2:24 PM  Vitals with BMI  Height 4' 10"  4' 10"  Weight 107 lbs  108 lbs  BMI 22.37  22.58  Systolic 130 146 151  Diastolic 54 59 54  Pulse 86  82      Physical Exam Neck:     Vascular: Carotid   bruit (bilateral) present. No JVD.  Cardiovascular:     Rate and Rhythm: Normal rate and regular rhythm.     Pulses:          Femoral pulses are 1+ on the right side and 2+ on the left side.      Popliteal pulses are 0 on the right side and 0 on the left side.       Dorsalis pedis pulses are 1+ on the right side and 0 on the left side.       Posterior tibial pulses are 0 on the left side.     Heart sounds: Normal heart sounds. No murmur heard.    No gallop.  Pulmonary:     Effort: Pulmonary effort is normal.     Breath sounds: Normal breath sounds.  Abdominal:     General: Bowel sounds are normal. There is abdominal bruit.     Palpations:  Abdomen is soft.  Musculoskeletal:     Right lower leg: No edema.     Left lower leg: No edema.  Skin:    Capillary Refill: Capillary refill takes less than 2 seconds.    Laboratory examination:   External Labs:  Cholesterol, total 140.000 m 02/20/2022 HDL 63.000 mg 02/20/2022 LDL 47.000 mg 02/20/2022 Triglycerides 186.000 m 02/20/2022  A1C 8.100 % 09/01/2022 TSH 3.300 02/20/2022  Hemoglobin 10.400 g/d 02/20/2022 Platelets 210.000 x1 02/20/2022  Creatinine, Serum 0.880 mg/ 02/20/2022 Potassium 5.500 mm 02/20/2022 ALT (SGPT) 26.000 IU/ 02/20/2022   Allergies   Allergies  Allergen Reactions   Jardiance [Empagliflozin] Other (See Comments)    Frequency of urination     Final Medications at End of Visit    Current Outpatient Medications:    amLODipine (NORVASC) 10 MG tablet, TAKE ONE TABLET BY MOUTH ONE TIME DAILY, Disp: 90 tablet, Rfl: 3   aspirin EC 81 MG tablet, Take 81 mg by mouth daily., Disp: , Rfl:    cyanocobalamin (VITAMIN B12) 1000 MCG tablet, Take 1,000 mcg by mouth daily., Disp: , Rfl:    folic acid (FOLVITE) 1 MG tablet, Take 1 mg by mouth every evening., Disp: , Rfl:    glipiZIDE (GLUCOTROL) 5 MG tablet, Take by mouth 2 (two) times daily., Disp: , Rfl:    hydrALAZINE (APRESOLINE) 50 MG tablet, Take 1 tablet (50 mg total) by mouth 3 (three) times daily., Disp: 270 tablet, Rfl: 3   losartan (COZAAR) 100 MG tablet, Take 50 mg by mouth daily., Disp: , Rfl:    metFORMIN (GLUCOPHAGE) 500 MG tablet, Take 1,000 mg by mouth 2 (two) times daily with a meal., Disp: , Rfl:    Nebivolol HCl 20 MG TABS, Take 1 tablet (20 mg total) by mouth daily., Disp: 90 tablet, Rfl: 3   pantoprazole (PROTONIX) 40 MG tablet, Take 40 mg by mouth daily., Disp: , Rfl:    pregabalin (LYRICA) 25 MG capsule, Take 25 mg by mouth 2 (two) times daily., Disp: , Rfl:    rosuvastatin (CRESTOR) 20 MG tablet, TAKE 1 TABLET BY MOUTH AT BEDTIME, Disp: 90 tablet, Rfl: 0   carbamazepine (TEGRETOL) 200 MG tablet,  Take 100 mg by mouth 2 (two) times daily., Disp: , Rfl:    Radiology:  No results found.  Cardiac Studies:   Echo- 04/24/13 1.Left ventricular cavity is normal in size.  Abnormal septal motion due to IVCD.  Lower limits systolic global function.   Calculated EF 55%.   Visual EF is 50-55%.   Doppler evidence of Grade   I (impaired) diastolic dysfunction. 2.Left atrial cavity is slightly dilated. 3.Mild calcification of the mitral annulus.   Trace mitral regurgitation.   Mitral valve inflow A > E ratio. 4.Tricuspid valve structurally normal.   Mild tricuspid regurgitation.  Lexiscan Myoview stress test 04/25/2013: 1. Resting EKG NSR, LBBB. Stress EKG was non diagnostic for ischemia. No ST-T changes of ischemia noted with pharmacologic stress testing. Stress symptoms included chest pressure, sob, stomach discomfort and headache. Stress terminated due to completion of protocol. 2. The perfusion study demonstrated mild breast attenuation artifact in the anterior wall. There was no evidence of ischemia or scar. Dynamic gated images reveal normal wall motion and endocardial thickening. Left ventricular ejection fraction was estimated to be 69%.  This represents a low risk study.  Peripheral arteriogram 12/25/2017: Left common iliac artery tandem 99% calcific stenosis S/P 2.0 solid crown CSI orbital atherectomy and Viabahn. Left AT appears occluded distally. Otherwise mild disease bilaterally.  Carotid artery duplex 08/30/2022: Duplex suggests stenosis in the right internal carotid artery (1-15%). Duplex suggests stenosis in the left internal carotid artery (1-15%). Antegrade right vertebral artery flow. Compared to 06/21/2021, left ICA stenosis of 15-49% has regressed. Further studies if clinically recommended.  Abdominal Aortic Duplex 12/12/2022: Mild plaque noted in the mid aorta.  Mild increase in the abdominal aortic velocity without hemodynamically significant stenosis. Normal multiphasic waveform  in the bilateral CIA, mild elevation in the left CIA velocity without significant stenosis.  No significant change fro 09/16/2020.  Lower Extremity Arterial Duplex 12/05/2022:  Dampened monophasic waveform in the right EIA and CFA may suggest  hemodynamically significant proximal (CIA) stenosis.  No hemodynamically significant stenoses are identified in the left lower  extremity arterial system.  This exam reveals mildly decreased perfusion of the right lower extremity,  noted at the dorsalis pedis artery level (ABI 0.95).   Non-compressible ABI on the left, suggestive of medial calcinosis.  Compared to the study done on 09/16/2020, right ABI 0.68 and left ABI 0.65.   Multiphasic waveform was noted in the right and right CFA.  Present study  also suggests patent left common iliac artery stent   EKG   EKG 10/16/2022: Normal sinus rhythm at the rate of 79 bpm, IVCD, atypical LBBB.  No significant change from 01/26/2022.  Assessment     ICD-10-CM   1. PAD (peripheral artery disease) (HCC)  I73.9     2. Primary hypertension  I10      No orders of the defined types were placed in this encounter.  Medications Discontinued During This Encounter  Medication Reason   empagliflozin (JARDIANCE) 25 MG TABS tablet      No orders of the defined types were placed in this encounter.   Recommendations:   Mrs. Brianna Hendricks is a 82 y.o. [place] female with history of chronic LBBB, hypertension, hyperlipidemia, diabetes mellitus and PAD S/P left iliac artery stent in 2019.  She presents for follow-up of claudication, underwent abdominal aortic duplex of lower extremity arterial Dopplers.  1. PAD (peripheral artery disease) (HCC) Patient is now having recurrence of symptoms of claudication and also cramping at night.  She is abnormal physical exam with absent pedal pulses and reduced right femoral pulse as well. On prior angiography, she was found to have severe atherosclerotic changes in  the abdominal aorta.  She may also be developing abdominal aortic stenosis.  Best option is to proceed with peripheral arteriogram to evaluate her abdominal aorta.  Discrepancy between abdominal aortic duplex and lower extremity   arterial Dopplers suggesting monophasic waveform pattern at the iliac levels suggesting proximal occlusion and/or stenosis. She is extremely symptomatic.  Question is whether her symptoms are related to severe PAD versus diabetic myopathy.  Diabetes continues to be uncontrolled.  We could certainly look into GLP-1 agonist to see if diabetes could be controlled better.   Will schedule for peripheral arteriogram and possible angioplasty given symptoms. Patient understands the risks, benefits, alternatives including medical therapy, CT angiography. Patient understands <1-2% risk of death, embolic complications, bleeding, infection, renal failure, urgent surgical revascularization, but not limited to these and wants to proceed.  I will request her PCP to perform BMP and CBC for preprocedure labs.   2. Primary hypertension Patient is tolerating hydralazine, excellent control of blood pressure, continue the same.  I would like to see her back in 6 weeks for follow-up.     Rolla Kedzierski, MD, FACC 12/19/2022, 3:02 PM Office: [phone] Pager: [phone]  

## 2022-12-20 DIAGNOSIS — I10 Essential (primary) hypertension: Secondary | ICD-10-CM | POA: Diagnosis not present

## 2022-12-20 DIAGNOSIS — Z79899 Other long term (current) drug therapy: Secondary | ICD-10-CM | POA: Diagnosis not present

## 2022-12-20 DIAGNOSIS — R5383 Other fatigue: Secondary | ICD-10-CM | POA: Diagnosis not present

## 2022-12-20 DIAGNOSIS — E1165 Type 2 diabetes mellitus with hyperglycemia: Secondary | ICD-10-CM | POA: Diagnosis not present

## 2022-12-20 DIAGNOSIS — D509 Iron deficiency anemia, unspecified: Secondary | ICD-10-CM | POA: Diagnosis not present

## 2022-12-20 DIAGNOSIS — Z299 Encounter for prophylactic measures, unspecified: Secondary | ICD-10-CM | POA: Diagnosis not present

## 2022-12-20 DIAGNOSIS — I739 Peripheral vascular disease, unspecified: Secondary | ICD-10-CM | POA: Diagnosis not present

## 2022-12-20 DIAGNOSIS — Z1211 Encounter for screening for malignant neoplasm of colon: Secondary | ICD-10-CM | POA: Diagnosis not present

## 2022-12-22 DIAGNOSIS — E1169 Type 2 diabetes mellitus with other specified complication: Secondary | ICD-10-CM | POA: Diagnosis not present

## 2022-12-29 ENCOUNTER — Other Ambulatory Visit: Payer: Self-pay

## 2022-12-29 ENCOUNTER — Other Ambulatory Visit: Payer: Self-pay | Admitting: Cardiology

## 2022-12-29 ENCOUNTER — Ambulatory Visit (HOSPITAL_COMMUNITY)
Admission: RE | Admit: 2022-12-29 | Discharge: 2022-12-29 | Disposition: A | Discharge status: Home / Self Care | Payer: Medicare Other | Attending: Cardiology | Admitting: Cardiology

## 2022-12-29 ENCOUNTER — Ambulatory Visit (HOSPITAL_COMMUNITY)
Admission: RE | Disposition: A | Discharge status: Home / Self Care | Payer: Self-pay | Source: Home / Self Care | Attending: Cardiology

## 2022-12-29 DIAGNOSIS — I447 Left bundle-branch block, unspecified: Secondary | ICD-10-CM | POA: Insufficient documentation

## 2022-12-29 DIAGNOSIS — I10 Essential (primary) hypertension: Secondary | ICD-10-CM | POA: Diagnosis not present

## 2022-12-29 DIAGNOSIS — Z79899 Other long term (current) drug therapy: Secondary | ICD-10-CM | POA: Diagnosis not present

## 2022-12-29 DIAGNOSIS — I739 Peripheral vascular disease, unspecified: Secondary | ICD-10-CM

## 2022-12-29 DIAGNOSIS — E785 Hyperlipidemia, unspecified: Secondary | ICD-10-CM | POA: Diagnosis not present

## 2022-12-29 DIAGNOSIS — E1151 Type 2 diabetes mellitus with diabetic peripheral angiopathy without gangrene: Secondary | ICD-10-CM | POA: Diagnosis not present

## 2022-12-29 DIAGNOSIS — I70211 Atherosclerosis of native arteries of extremities with intermittent claudication, right leg: Secondary | ICD-10-CM | POA: Insufficient documentation

## 2022-12-29 DIAGNOSIS — I701 Atherosclerosis of renal artery: Secondary | ICD-10-CM | POA: Diagnosis not present

## 2022-12-29 DIAGNOSIS — I70201 Unspecified atherosclerosis of native arteries of extremities, right leg: Secondary | ICD-10-CM | POA: Diagnosis not present

## 2022-12-29 HISTORY — PX: PERIPHERAL VASCULAR INTERVENTION: CATH118257

## 2022-12-29 HISTORY — PX: ABDOMINAL AORTOGRAM W/LOWER EXTREMITY: CATH118223

## 2022-12-29 HISTORY — PX: PERIPHERAL INTRAVASCULAR LITHOTRIPSY: CATH118324

## 2022-12-29 LAB — GLUCOSE, CAPILLARY
Glucose-Capillary: 257 mg/dL — ABNORMAL HIGH (ref 70–99)
Glucose-Capillary: 209 mg/dL — ABNORMAL HIGH (ref 70–99)

## 2022-12-29 LAB — POCT I-STAT, CHEM 8
BUN: 18 mg/dL (ref 8–23)
Calcium, Ion: 1.13 mmol/L — ABNORMAL LOW (ref 1.15–1.40)
Chloride: 106 mmol/L (ref 98–111)
Creatinine, Ser: 0.9 mg/dL (ref 0.44–1.00)
Glucose, Bld: 278 mg/dL — ABNORMAL HIGH (ref 70–99)
HCT: 29 % — ABNORMAL LOW (ref 36.0–46.0)
Hemoglobin: 9.9 g/dL — ABNORMAL LOW (ref 12.0–15.0)
Potassium: 4.3 mmol/L (ref 3.5–5.1)
Sodium: 139 mmol/L (ref 135–145)
TCO2: 22 mmol/L (ref 22–32)

## 2022-12-29 SURGERY — ABDOMINAL AORTOGRAM W/LOWER EXTREMITY
Anesthesia: LOCAL

## 2022-12-29 MED ORDER — ONDANSETRON HCL 4 MG PO TABS: 4.0000 mg | ORAL_TABLET | Freq: Three times a day (TID) | ORAL | 0 refills | Status: DC | PRN

## 2022-12-29 MED ORDER — HEPARIN SODIUM (PORCINE) 1000 UNIT/ML IJ SOLN
INTRAMUSCULAR | Status: AC
  Filled 2022-12-29: qty 10

## 2022-12-29 MED ORDER — SODIUM CHLORIDE 0.9 % IV SOLN: Freq: Once | INTRAVENOUS | Status: DC

## 2022-12-29 MED ORDER — HEPARIN SODIUM (PORCINE) 1000 UNIT/ML IJ SOLN
INTRAMUSCULAR | Status: DC | PRN
  Administered 2022-12-29: 6000 [IU] via INTRAVENOUS

## 2022-12-29 MED ORDER — HYDROCODONE-ACETAMINOPHEN 5-325 MG PO TABS: 1.0000 | ORAL_TABLET | ORAL | 0 refills | Status: DC | PRN

## 2022-12-29 MED ORDER — HYDRALAZINE HCL 20 MG/ML IJ SOLN: 5.0000 mg | INTRAMUSCULAR | Status: DC | PRN

## 2022-12-29 MED ORDER — CEFAZOLIN SODIUM-DEXTROSE 2-3 GM-%(50ML) IV SOLR
INTRAVENOUS | Status: DC | PRN
  Administered 2022-12-29: 2 g via INTRAVENOUS

## 2022-12-29 MED ORDER — FENTANYL CITRATE (PF) 100 MCG/2ML IJ SOLN
INTRAMUSCULAR | Status: DC | PRN
  Administered 2022-12-29 (×3): 50 ug via INTRAVENOUS

## 2022-12-29 MED ORDER — ONDANSETRON HCL 4 MG/2ML IJ SOLN
4.0000 mg | Freq: Four times a day (QID) | INTRAMUSCULAR | Status: DC | PRN
  Administered 2022-12-29: 4 mg via INTRAVENOUS
  Filled 2022-12-29: qty 2

## 2022-12-29 MED ORDER — SODIUM CHLORIDE 0.9 % IV SOLN: 250.0000 mL | INTRAVENOUS | Status: DC | PRN

## 2022-12-29 MED ORDER — LIDOCAINE HCL (PF) 1 % IJ SOLN
INTRAMUSCULAR | Status: AC
  Filled 2022-12-29: qty 30

## 2022-12-29 MED ORDER — HEPARIN (PORCINE) IN NACL 1000-0.9 UT/500ML-% IV SOLN
INTRAVENOUS | Status: DC | PRN
  Administered 2022-12-29 (×2): 500 mL

## 2022-12-29 MED ORDER — CEFAZOLIN SODIUM-DEXTROSE 2-4 GM/100ML-% IV SOLN
INTRAVENOUS | Status: AC
  Filled 2022-12-29: qty 100

## 2022-12-29 MED ORDER — ACETAMINOPHEN 325 MG PO TABS: 650.0000 mg | ORAL_TABLET | ORAL | Status: DC | PRN

## 2022-12-29 MED ORDER — CLOPIDOGREL BISULFATE 300 MG PO TABS
ORAL_TABLET | ORAL | Status: AC
  Filled 2022-12-29: qty 1

## 2022-12-29 MED ORDER — IODIXANOL 320 MG/ML IV SOLN
INTRAVENOUS | Status: DC | PRN
  Administered 2022-12-29: 140 mL via INTRA_ARTERIAL

## 2022-12-29 MED ORDER — MIDAZOLAM HCL 2 MG/2ML IJ SOLN
INTRAMUSCULAR | Status: AC
  Filled 2022-12-29: qty 2

## 2022-12-29 MED ORDER — CLOPIDOGREL BISULFATE 300 MG PO TABS
ORAL_TABLET | ORAL | Status: DC | PRN
  Administered 2022-12-29: 300 mg via ORAL

## 2022-12-29 MED ORDER — ASPIRIN 81 MG PO CHEW
CHEWABLE_TABLET | ORAL | Status: AC
  Filled 2022-12-29: qty 1

## 2022-12-29 MED ORDER — FENTANYL CITRATE (PF) 100 MCG/2ML IJ SOLN
INTRAMUSCULAR | Status: AC
  Filled 2022-12-29: qty 2

## 2022-12-29 MED ORDER — MIDAZOLAM HCL 2 MG/2ML IJ SOLN
INTRAMUSCULAR | Status: DC | PRN
  Administered 2022-12-29: 1 mg via INTRAVENOUS

## 2022-12-29 MED ORDER — SODIUM CHLORIDE 0.9% FLUSH: 3.0000 mL | Freq: Two times a day (BID) | INTRAVENOUS | Status: DC

## 2022-12-29 MED ORDER — LIDOCAINE HCL (PF) 1 % IJ SOLN
INTRAMUSCULAR | Status: DC | PRN
  Administered 2022-12-29 (×2): 15 mL

## 2022-12-29 MED ORDER — ASPIRIN 81 MG PO CHEW
CHEWABLE_TABLET | ORAL | Status: DC | PRN
  Administered 2022-12-29: 324 mg via ORAL

## 2022-12-29 MED ORDER — FAMOTIDINE IN NACL 20-0.9 MG/50ML-% IV SOLN
INTRAVENOUS | Status: AC
  Administered 2022-12-29: 20 mg
  Filled 2022-12-29: qty 50

## 2022-12-29 MED ORDER — SODIUM CHLORIDE 0.9 % WEIGHT BASED INFUSION: 1.0000 mL/kg/h | INTRAVENOUS | Status: DC

## 2022-12-29 MED ORDER — SODIUM CHLORIDE 0.9% FLUSH: 3.0000 mL | INTRAVENOUS | Status: DC | PRN

## 2022-12-29 MED ORDER — CLOPIDOGREL BISULFATE 75 MG PO TABS: 75.0000 mg | ORAL_TABLET | Freq: Every day | ORAL | 0 refills | Status: AC

## 2022-12-29 SURGICAL SUPPLY — 21 items
CATH OMNI FLUSH 5F 65CM (CATHETERS) IMPLANT
CATH SHOCKWAVE M5 8.0X60 (CATHETERS) IMPLANT
CATH STRAIGHT 5FR 65CM (CATHETERS) IMPLANT
CLOSURE PERCLOSE PROSTYLE (VASCULAR PRODUCTS) IMPLANT
GUIDEWIRE NITREX 0.018X80X5 (WIRE) IMPLANT
KIT ENCORE 26 ADVANTAGE (KITS) IMPLANT
KIT MICROPUNCTURE NIT STIFF (SHEATH) IMPLANT
KIT PV (KITS) ×2 IMPLANT
SHEATH CATAPULT 8F 45 ST (SHEATH) IMPLANT
SHEATH PINNACLE 5F 10CM (SHEATH) IMPLANT
SHEATH PROBE COVER 6X72 (BAG) IMPLANT
STENT VIABAHN 39XCATH 80 (Permanent Stent) IMPLANT
STENT VIABAHN 8X39 7FR 80 (Permanent Stent) ×2 IMPLANT
STOPCOCK MORSE 400PSI 3WAY (MISCELLANEOUS) IMPLANT
SYR MEDRAD MARK 7 150ML (SYRINGE) ×2 IMPLANT
TAPE SHOOT N SEE (TAPE) IMPLANT
TRANSDUCER W/STOPCOCK (MISCELLANEOUS) ×2 IMPLANT
TRAY PV CATH (CUSTOM PROCEDURE TRAY) ×2 IMPLANT
TUBING CIL FLEX 10 FLL-RA (TUBING) IMPLANT
WIRE HITORQ VERSACORE ST 145CM (WIRE) IMPLANT
WIRE SPARTACORE .014X300CM (WIRE) IMPLANT

## 2022-12-29 NOTE — Progress Notes (Signed)
Pt ambulated to and from the bathroom to void with no signs of oozing from bilateral groin sites

## 2022-12-29 NOTE — Progress Notes (Signed)
Assumed care of pt at this time.

## 2022-12-29 NOTE — Discharge Instructions (Signed)
Femoral Site Care This sheet gives you information about how to care for yourself after your procedure. Your health care provider may also give you more specific instructions. If you have problems or questions, contact your health care provider. What can I expect after the procedure?  After the procedure, it is common to have: Bruising that usually fades within 1-2 weeks. Tenderness at the site. Follow these instructions at home: Wound care Follow instructions from your health care provider about how to take care of your insertion site. Make sure you: Wash your hands with soap and water before you change your bandage (dressing). If soap and water are not available, use hand sanitizer. Remove your dressing as told by your health care provider. 24 hours Do not take baths, swim, or use a hot tub until your health care provider approves. You may shower 24-48 hours after the procedure or as told by your health care provider. Gently wash the site with plain soap and water. Pat the area dry with a clean towel. Do not rub the site. This may cause bleeding. Do not apply powder or lotion to the site. Keep the site clean and dry. Check your femoral site every day for signs of infection. Check for: Redness, swelling, or pain. Fluid or blood. Warmth. Pus or a bad smell. Activity For the first 2-3 days after your procedure, or as long as directed: Avoid climbing stairs as much as possible. Do not squat. Do not lift anything that is heavier than 10 lb (4.5 kg), or the limit that you are told, until your health care provider says that it is safe. For 5 days Rest as directed. Avoid sitting for a long time without moving. Get up to take short walks every 1-2 hours. Do not drive for 24 hours if you were given a medicine to help you relax (sedative). General instructions Take over-the-counter and prescription medicines only as told by your health care provider. Keep all follow-up visits as told by your  health care provider. This is important. Contact a health care provider if you have: A fever or chills. You have redness, swelling, or pain around your insertion site. Get help right away if: The catheter insertion area swells very fast. You pass out. You suddenly start to sweat or your skin gets clammy. The catheter insertion area is bleeding, and the bleeding does not stop when you hold steady pressure on the area. The area near or just beyond the catheter insertion site becomes pale, cool, tingly, or numb. These symptoms may represent a serious problem that is an emergency. Do not wait to see if the symptoms will go away. Get medical help right away. Call your local emergency services (911 in the [place] Do not drive yourself to the hospital. Summary After the procedure, it is common to have bruising that usually fades within 1-2 weeks. Check your femoral site every day for signs of infection. Do not lift anything that is heavier than 10 lb (4.5 kg), or the limit that you are told, until your health care provider says that it is safe. This information is not intended to replace advice given to you by your health care provider. Make sure you discuss any questions you have with your health care provider. Document Revised: 07/23/2017 Document Reviewed: 07/23/2017 Elsevier Patient Education  2020 [employer] Inc[employer]  

## 2022-12-29 NOTE — Interval H&P Note (Signed)
History and Physical Interval Note:  12/29/2022 10:26 AM  Brianna Hendricks  has presented today for surgery, with the diagnosis of htn.  The various methods of treatment have been discussed with the patient and family. After consideration of risks, benefits and other options for treatment, the patient has consented to  Procedure(s): ABDOMINAL AORTOGRAM W/LOWER EXTREMITY (N/A) and possible intervention as a surgical intervention.  The patient's history has been reviewed, patient examined, no change in status, stable for surgery.  I have reviewed the patient's chart and labs.  Questions were answered to the patient's satisfaction.     Yates Decamp

## 2023-01-01 ENCOUNTER — Encounter (HOSPITAL_COMMUNITY): Payer: Self-pay | Admitting: Cardiology

## 2023-01-01 MED FILL — Cefazolin Sodium-Dextrose IV Solution 2 GM/100ML-4%: INTRAVENOUS | Qty: 100 | Status: AC

## 2023-01-05 ENCOUNTER — Telehealth: Payer: Self-pay

## 2023-01-05 DIAGNOSIS — R4189 Other symptoms and signs involving cognitive functions and awareness: Secondary | ICD-10-CM | POA: Diagnosis not present

## 2023-01-05 DIAGNOSIS — R32 Unspecified urinary incontinence: Secondary | ICD-10-CM | POA: Diagnosis not present

## 2023-01-05 NOTE — Telephone Encounter (Signed)
JG patient :   Patients husband called and stated that patient was recently started on Plavix and now she is having to go to the bathroom more frequently and pain with urination. He wants to know what he needs to do, or can she get a different medication. Please advise.

## 2023-01-05 NOTE — Telephone Encounter (Signed)
Does not sound like it is related to the plavix. Probably urinary tract infection. Please check with PCP.  Thanks MJP

## 2023-01-05 NOTE — Telephone Encounter (Signed)
Patient husband is aware and will take her to see her PCP

## 2023-01-12 ENCOUNTER — Other Ambulatory Visit: Payer: Medicare Other

## 2023-01-15 ENCOUNTER — Encounter: Payer: Self-pay | Admitting: Cardiology

## 2023-01-15 ENCOUNTER — Ambulatory Visit: Payer: Medicare Other | Admitting: Cardiology

## 2023-01-15 ENCOUNTER — Ambulatory Visit: Payer: Medicare Other

## 2023-01-15 VITALS — BP 128/52 | HR 56 | Ht <= 58 in | Wt 106.2 lb

## 2023-01-15 DIAGNOSIS — L500 Allergic urticaria: Secondary | ICD-10-CM

## 2023-01-15 DIAGNOSIS — I739 Peripheral vascular disease, unspecified: Secondary | ICD-10-CM | POA: Diagnosis not present

## 2023-01-15 DIAGNOSIS — T50905A Adverse effect of unspecified drugs, medicaments and biological substances, initial encounter: Secondary | ICD-10-CM | POA: Diagnosis not present

## 2023-01-15 DIAGNOSIS — I10 Essential (primary) hypertension: Secondary | ICD-10-CM

## 2023-01-15 MED ORDER — HYDROXYZINE PAMOATE 100 MG PO CAPS
100.0000 mg | ORAL_CAPSULE | Freq: Three times a day (TID) | ORAL | 0 refills | Status: DC | PRN
Start: 2023-01-15 — End: 2023-01-30

## 2023-01-15 NOTE — Progress Notes (Signed)
Primary Physician/Referring:  Ignatius Specking, MD  Patient ID: Marcelle Overlie, female    DOB: 1941/01/27, 82 y.o.   MRN: [id number]  Chief Complaint  Patient presents with   Follow-up   PAD   Hospitalization Follow-up        Pruritis    HPI:    HPI: Brady Cerinity Gadomski  is a 82 y.o. Asian [place] female with history of chronic LBBB, hypertension, hyperlipidemia, diabetes mellitus and PAD.    She underwent peripheral arteriogram and angioplasty to high-grade stenosis of right common iliac artery on 12/29/2022. Patient symptoms of claudication have essentially completely resolved.  Still with mild urticaria. Her husband is present.  Past Medical History:  Diagnosis Date   Arthritis    "some in my" 4th digit right hand  (12/25/2017)   High cholesterol    History of blood transfusion    "related to female bleeding"   Hypertension    PAD (peripheral artery disease) (HCC)    Stroke (HCC) 1998   "fully recovered" (12/25/2017)   Type II diabetes mellitus (HCC)    Past Surgical History:  Procedure Laterality Date   ABDOMINAL AORTOGRAM N/A 12/25/2017   Procedure: ABDOMINAL AORTOGRAM;  Surgeon: Yates Decamp, MD;  Location: [facility];  Service: Cardiovascular;  Laterality: N/A;   ABDOMINAL AORTOGRAM W/LOWER EXTREMITY N/A 12/29/2022   Procedure: ABDOMINAL AORTOGRAM W/LOWER EXTREMITY;  Surgeon: Yates Decamp, MD;  Location: [facility];  Service: Cardiovascular;  Laterality: N/A;   CATARACT EXTRACTION W/ INTRAOCULAR LENS  IMPLANT, BILATERAL Bilateral    CHOLECYSTECTOMY     LOWER EXTREMITY ANGIOGRAPHY Bilateral 12/25/2017   Procedure: LOWER EXTREMITY ANGIOGRAPHY;  Surgeon: Yates Decamp, MD;  Location: [facility];  Service: Cardiovascular;  Laterality: Bilateral;   PERIPHERAL INTRAVASCULAR LITHOTRIPSY  12/29/2022   Procedure: PERIPHERAL INTRAVASCULAR LITHOTRIPSY;  Surgeon: Yates Decamp, MD;  Location: [facility];  Service: Cardiovascular;;   PERIPHERAL  VASCULAR ATHERECTOMY Left 12/25/2017   Procedure: PERIPHERAL VASCULAR ATHERECTOMY;  Surgeon: Yates Decamp, MD;  Location: [facility];  Service: Cardiovascular;  Laterality: Left;  common iliac   PERIPHERAL VASCULAR INTERVENTION Left 12/25/2017   Procedure: PERIPHERAL VASCULAR INTERVENTION;  Surgeon: Yates Decamp, MD;  Location: [facility];  Service: Cardiovascular;  Laterality: Left;  common iliac   PERIPHERAL VASCULAR INTERVENTION  12/29/2022   Procedure: PERIPHERAL VASCULAR INTERVENTION;  Surgeon: Yates Decamp, MD;  Location: [facility];  Service: Cardiovascular;;   THYROID SURGERY  1972   " not sure what was done:"   TONSILLECTOMY     Family History  Problem Relation Age of Onset   Heart attack Father    Social History   Tobacco Use   Smoking status: Never   Smokeless tobacco: Never  Substance Use Topics   Alcohol use: Never  Marital Status: Married    ROS  Review of Systems  Cardiovascular:  Negative for chest pain, claudication, dyspnea on exertion and leg swelling.  Neurological:  Positive for paresthesias (feet and hands).   Objective  Blood pressure (!) 128/52, pulse (!) 56, height 4\' 10"  (1.473 m), weight 106 lb 3.2 oz (48.2 kg), SpO2 97 %. Body mass index is 22.2 kg/m.      01/15/2023    1:09 PM 01/15/2023    1:00 PM 12/29/2022    5:05 PM  Vitals with BMI  Height  4\' 10"    Weight  106 lbs 3 oz   BMI  22.2  Systolic 128 165 409  Diastolic 52 59 47  Pulse 56 62 50      Physical Exam Neck:     Vascular: Carotid bruit (bilateral) present. No JVD.  Cardiovascular:     Rate and Rhythm: Normal rate and regular rhythm.     Pulses:          Femoral pulses are 1+ on the right side and 2+ on the left side.      Popliteal pulses are 2+ on the right side and 0 on the left side.       Dorsalis pedis pulses are 2+ on the right side and 0 on the left side.       Posterior tibial pulses are 0 on the right side and 0 on the left side.     Heart sounds: Normal  heart sounds. No murmur heard.    No gallop.  Pulmonary:     Effort: Pulmonary effort is normal.     Breath sounds: Normal breath sounds.  Abdominal:     General: Bowel sounds are normal. There is abdominal bruit.     Palpations: Abdomen is soft.  Musculoskeletal:     Right lower leg: No edema.     Left lower leg: No edema.  Skin:    Capillary Refill: Capillary refill takes less than 2 seconds.    Laboratory examination:   External Labs:  Cholesterol, total 140.000 m 02/20/2022 HDL 63.000 mg 02/20/2022 LDL 47.000 mg 02/20/2022 Triglycerides 186.000 m 02/20/2022  A1C 8.100 % 09/01/2022 TSH 3.300 02/20/2022  Hemoglobin 10.400 g/d 02/20/2022 Platelets 210.000 x1 02/20/2022  Creatinine, Serum 0.880 mg/ 02/20/2022 Potassium 5.500 mm 02/20/2022 ALT (SGPT) 26.000 IU/ 02/20/2022   Allergies   No Known Allergies    Final Medications at End of Visit    Current Outpatient Medications:    amLODipine (NORVASC) 10 MG tablet, TAKE ONE TABLET BY MOUTH ONE TIME DAILY (Patient taking differently: Take 10 mg by mouth at bedtime.), Disp: 90 tablet, Rfl: 3   aspirin EC 81 MG tablet, Take 81 mg by mouth daily., Disp: , Rfl:    carbamazepine (TEGRETOL) 100 MG chewable tablet, Chew 50 mg by mouth 2 (two) times daily., Disp: , Rfl:    clopidogrel (PLAVIX) 75 MG tablet, Take 1 tablet (75 mg total) by mouth daily., Disp: 90 tablet, Rfl: 0   diclofenac Sodium (VOLTAREN) 1 % GEL, Apply 1 Application topically at bedtime as needed (pain)., Disp: , Rfl:    diphenhydrAMINE (BENADRYL) 25 MG tablet, Take 25 mg by mouth every 6 (six) hours as needed., Disp: , Rfl:    doxylamine, Sleep, (UNISOM) 25 MG tablet, Take 25 mg by mouth at bedtime., Disp: , Rfl:    empagliflozin (JARDIANCE) 10 MG TABS tablet, Take 10 mg by mouth daily., Disp: , Rfl:    folic acid (FOLVITE) 1 MG tablet, Take 1 mg by mouth every evening., Disp: , Rfl:    glipiZIDE (GLUCOTROL) 5 MG tablet, Take 5 mg by mouth 2 (two) times daily., Disp: ,  Rfl:    hydrOXYzine (VISTARIL) 100 MG capsule, Take 1 capsule (100 mg total) by mouth 3 (three) times daily as needed for itching., Disp: 30 capsule, Rfl: 0   Menthol, Topical Analgesic, (BIOFREEZE EX), Apply 1 Application topically at bedtime as needed (pain)., Disp: , Rfl:    metFORMIN (GLUCOPHAGE-XR) 500 MG 24 hr tablet, Take 500 mg by mouth 2 (two) times daily with a meal., Disp: , Rfl:    Nebivolol HCl  20 MG TABS, Take 1 tablet (20 mg total) by mouth daily., Disp: 90 tablet, Rfl: 3   pantoprazole (PROTONIX) 40 MG tablet, Take 40 mg by mouth daily before supper., Disp: , Rfl:    pregabalin (LYRICA) 25 MG capsule, Take 25 mg by mouth 2 (two) times daily., Disp: , Rfl:    rosuvastatin (CRESTOR) 20 MG tablet, TAKE 1 TABLET BY MOUTH AT BEDTIME (Patient taking differently: Take 10 mg by mouth at bedtime.), Disp: 90 tablet, Rfl: 0   vitamin B-12 (CYANOCOBALAMIN) 500 MCG tablet, Take 500 mcg by mouth daily., Disp: , Rfl:    losartan (COZAAR) 100 MG tablet, Take 100 mg by mouth daily., Disp: , Rfl:    Radiology:  No results found.  Cardiac Studies:   Echo- 04/24/13 1.Left ventricular cavity is normal in size.  Abnormal septal motion due to IVCD.  Lower limits systolic global function.   Calculated EF 55%.   Visual EF is 50-55%.   Doppler evidence of Grade I (impaired) diastolic dysfunction. 2.Left atrial cavity is slightly dilated. 3.Mild calcification of the mitral annulus.   Trace mitral regurgitation.   Mitral valve inflow A > E ratio. 4.Tricuspid valve structurally normal.   Mild tricuspid regurgitation.  Lexiscan Myoview stress test 04/25/2013: 1. Resting EKG NSR, LBBB. Stress EKG was non diagnostic for ischemia. No ST-T changes of ischemia noted with pharmacologic stress testing. Stress symptoms included chest pressure, sob, stomach discomfort and headache. Stress terminated due to completion of protocol. 2. The perfusion study demonstrated mild breast attenuation artifact in the anterior  wall. There was no evidence of ischemia or scar. Dynamic gated images reveal normal wall motion and endocardial thickening. Left ventricular ejection fraction was estimated to be 69%.  This represents a low risk study.  Carotid artery duplex 08/30/2022: Duplex suggests stenosis in the right internal carotid artery (1-15%). Duplex suggests stenosis in the left internal carotid artery (1-15%). Antegrade right vertebral artery flow. Compared to 06/21/2021, left ICA stenosis of 15-49% has regressed. Further studies if clinically recommended.  Abdominal Aortic Duplex 12/12/2022: Mild plaque noted in the mid aorta.  Mild increase in the abdominal aortic velocity without hemodynamically significant stenosis. Normal multiphasic waveform in the bilateral CIA, mild elevation in the left CIA velocity without significant stenosis.  No significant change fro 09/16/2020.  Lower Extremity Arterial Duplex 12/05/2022:  Dampened monophasic waveform in the right EIA and CFA may suggest  hemodynamically significant proximal (CIA) stenosis.  No hemodynamically significant stenoses are identified in the left lower  extremity arterial system.  This exam reveals mildly decreased perfusion of the right lower extremity,  noted at the dorsalis pedis artery level (ABI 0.95).   Non-compressible ABI on the left, suggestive of medial calcinosis.  Compared to the study done on 09/16/2020, right ABI 0.68 and left ABI 0.65.   Multiphasic waveform was noted in the right and right CFA.  Present study  also suggests patent left common iliac artery stent   Peripheral arteriogram 12/29/2022:  Abdominal aorta shows mild calcification of the abdominal aorta especially distal abdominal aorta.  2 renal arteries on right with mild calcific 20 to 30% stenosis of the superior branch in the midsegment, one renal artery on left, widely patent.   Left common iliac artery 8.0 x 39 mm Viabahn stent placed in 2021 is widely patent.  There is no  significant disease in the left SFA.  Three-vessel runoff below the left knee.   Right common iliac artery proximal segment had a 80% calcific  stenosis with a 60 mm pressure gradient.  Successful shockwave 8.0 x 60 mm balloon lithotripsy followed by 8.0 x 39 mm covered VBX stent deployed with stenosis reduced to 0%. Right SFA is disease-free and there is three-vessel runoff below the right knee.    Recommendation: Patient will need continued aspirin indefinitely, Plavix for 90 days.  Discharge home today.  ABI 01/12/2023: This exam reveals normal perfusion of the right lower extremity (ABI 0.98) with mildly abnormal biphasic waveform pattern at the ankle.   This exam reveals mildly decreased perfusion of the left lower extremity, noted at the dorsalis pedis and post tibial artery level (AB 0.89) with mildly abnormal biphasic waveform pattern at the ankle.   Compared to 12/05/2022, left ABI could not be obtained and right ABI was 0.95 with severely abnormal monophasic waveform pattern.  EKG   EKG 10/16/2022: Normal sinus rhythm at the rate of 79 bpm, IVCD, atypical LBBB.  No significant change from 01/26/2022.  Assessment     ICD-10-CM   1. PAD (peripheral artery disease) (HCC)  I73.9 PCV ANKLE BRACHIAL INDEX (ABI)    2. Primary hypertension  I10     3. Urticaria due to drug allergy  L50.0 hydrOXYzine (VISTARIL) 100 MG capsule   T50.905A      Meds ordered this encounter  Medications   hydrOXYzine (VISTARIL) 100 MG capsule    Sig: Take 1 capsule (100 mg total) by mouth 3 (three) times daily as needed for itching.    Dispense:  30 capsule    Refill:  0   Medications Discontinued During This Encounter  Medication Reason   hydrALAZINE (APRESOLINE) 50 MG tablet    HYDROcodone-acetaminophen (NORCO/VICODIN) 5-325 MG tablet    ondansetron (ZOFRAN) 4 MG tablet      No orders of the defined types were placed in this encounter.   Recommendations:   Mrs. Idalia Clasen is a 82 y.o.  Asian [place] female with history of chronic LBBB, hypertension, hyperlipidemia, diabetes mellitus and PAD.   1. PAD (peripheral artery disease) (HCC) She underwent peripheral arteriogram and angioplasty to high-grade stenosis of right common iliac artery on 12/29/2022. Patient symptoms of claudication have essentially completely resolved.  She is now bounding pulses in the lower extremities.  I reviewed her ABI, she now has mildly abnormal biphasic waveform pattern at the right ankle.  She also has palpable pulses.  No complications from the procedure, groin site has healed well.  - PCV ANKLE BRACHIAL INDEX (ABI); Future  2. Primary hypertension Blood pressure in excellent control, no changes in the medications were done today.  3. Urticaria due to drug allergy She has noted mild at DKA, could be related to contrast exposure but could also be related to Plavix.  Advised her that she is going to be on Plavix only for additional month and she can discontinue this.  For now she can try hydroxyzine on a as needed basis.  She will contact me if she has more issues with regard to this.  If it is related to contrast, appears to be very low allergy risk.  I will see her back in 6 months for follow-up, she will get ABI performed again in 3 months for patency of iliac stents. - hydrOXYzine (VISTARIL) 100 MG capsule; Take 1 capsule (100 mg total) by mouth 3 (three) times daily as needed for itching.  Dispense: 30 capsule; Refill: 0  Other orders - metFORMIN (GLUCOPHAGE-XR) 500 MG 24 hr tablet; Take 500 mg by mouth 2 (  two) times daily with a meal. - diphenhydrAMINE (BENADRYL) 25 MG tablet; Take 25 mg by mouth every 6 (six) hours as needed.    Yates Decamp, MD, [facility] 01/15/2023, 1:44 PM Office: [phone] Pager: [phone]

## 2023-01-16 ENCOUNTER — Encounter: Payer: Self-pay | Admitting: Cardiology

## 2023-01-16 DIAGNOSIS — R634 Abnormal weight loss: Secondary | ICD-10-CM | POA: Diagnosis not present

## 2023-01-16 DIAGNOSIS — Z85038 Personal history of other malignant neoplasm of large intestine: Secondary | ICD-10-CM | POA: Diagnosis not present

## 2023-01-16 DIAGNOSIS — Z1211 Encounter for screening for malignant neoplasm of colon: Secondary | ICD-10-CM | POA: Diagnosis not present

## 2023-01-16 DIAGNOSIS — E782 Mixed hyperlipidemia: Secondary | ICD-10-CM | POA: Diagnosis not present

## 2023-01-16 DIAGNOSIS — I10 Essential (primary) hypertension: Secondary | ICD-10-CM | POA: Diagnosis not present

## 2023-01-16 DIAGNOSIS — E119 Type 2 diabetes mellitus without complications: Secondary | ICD-10-CM | POA: Diagnosis not present

## 2023-01-16 DIAGNOSIS — K219 Gastro-esophageal reflux disease without esophagitis: Secondary | ICD-10-CM | POA: Diagnosis not present

## 2023-01-16 DIAGNOSIS — D509 Iron deficiency anemia, unspecified: Secondary | ICD-10-CM | POA: Diagnosis not present

## 2023-01-21 DIAGNOSIS — E1169 Type 2 diabetes mellitus with other specified complication: Secondary | ICD-10-CM | POA: Diagnosis not present

## 2023-01-22 ENCOUNTER — Other Ambulatory Visit: Payer: Self-pay | Admitting: Cardiology

## 2023-01-22 DIAGNOSIS — E782 Mixed hyperlipidemia: Secondary | ICD-10-CM

## 2023-01-29 ENCOUNTER — Telehealth: Payer: Self-pay

## 2023-01-29 NOTE — Telephone Encounter (Signed)
Patients not sleeping after surgery also can I send in refill for hydroxyzine?

## 2023-01-29 NOTE — Telephone Encounter (Signed)
She can stop Plavix to see if itching stops. I am not sure why she has itching, she may need to follow up with PCP. Yes you can refill for 30 days and 1 refill

## 2023-01-30 ENCOUNTER — Other Ambulatory Visit: Payer: Self-pay

## 2023-01-30 ENCOUNTER — Encounter: Payer: Self-pay | Admitting: Cardiology

## 2023-01-30 DIAGNOSIS — L500 Allergic urticaria: Secondary | ICD-10-CM

## 2023-01-30 MED ORDER — HYDROXYZINE PAMOATE 100 MG PO CAPS
100.0000 mg | ORAL_CAPSULE | Freq: Three times a day (TID) | ORAL | 0 refills | Status: DC | PRN
Start: 2023-01-30 — End: 2023-03-14

## 2023-01-30 NOTE — Telephone Encounter (Signed)
Tried calling pt's husband n/a

## 2023-01-30 NOTE — Telephone Encounter (Signed)
S/w pt's husband he is aware

## 2023-02-05 DIAGNOSIS — Z299 Encounter for prophylactic measures, unspecified: Secondary | ICD-10-CM | POA: Diagnosis not present

## 2023-02-05 DIAGNOSIS — R413 Other amnesia: Secondary | ICD-10-CM | POA: Diagnosis not present

## 2023-02-05 DIAGNOSIS — E1140 Type 2 diabetes mellitus with diabetic neuropathy, unspecified: Secondary | ICD-10-CM | POA: Diagnosis not present

## 2023-02-05 DIAGNOSIS — I10 Essential (primary) hypertension: Secondary | ICD-10-CM | POA: Diagnosis not present

## 2023-02-05 DIAGNOSIS — D509 Iron deficiency anemia, unspecified: Secondary | ICD-10-CM | POA: Diagnosis not present

## 2023-02-05 DIAGNOSIS — G4710 Hypersomnia, unspecified: Secondary | ICD-10-CM | POA: Diagnosis not present

## 2023-02-16 DIAGNOSIS — R5383 Other fatigue: Secondary | ICD-10-CM | POA: Diagnosis not present

## 2023-02-16 DIAGNOSIS — N390 Urinary tract infection, site not specified: Secondary | ICD-10-CM | POA: Diagnosis not present

## 2023-02-21 DIAGNOSIS — E1169 Type 2 diabetes mellitus with other specified complication: Secondary | ICD-10-CM | POA: Diagnosis not present

## 2023-03-01 ENCOUNTER — Encounter: Payer: Self-pay | Admitting: Cardiology

## 2023-03-14 ENCOUNTER — Ambulatory Visit: Payer: Medicare Other | Admitting: Cardiology

## 2023-03-14 ENCOUNTER — Encounter: Payer: Self-pay | Admitting: Cardiology

## 2023-03-14 VITALS — BP 130/66 | HR 69 | Resp 16 | Ht <= 58 in | Wt 108.2 lb

## 2023-03-14 DIAGNOSIS — Z01818 Encounter for other preprocedural examination: Secondary | ICD-10-CM

## 2023-03-14 DIAGNOSIS — I10 Essential (primary) hypertension: Secondary | ICD-10-CM | POA: Diagnosis not present

## 2023-03-14 DIAGNOSIS — I739 Peripheral vascular disease, unspecified: Secondary | ICD-10-CM | POA: Diagnosis not present

## 2023-03-14 NOTE — Patient Instructions (Signed)
On the day of the colonoscopy, Take all your BP medication 2 hours previously.   After 1 hour If BP > 150 mm Hg, Take two extra hydralazine

## 2023-03-14 NOTE — Progress Notes (Signed)
Primary Physician/Referring:  Ignatius Specking, MD  Patient ID: Brianna Hendricks, female    DOB: [birth year], 82 y.o.   MRN: [id number]  Chief Complaint  Patient presents with   PAD (peripheral artery disease)    Primary hypertension   Follow-up    HPI:    HPI: Brianna Hendricks  is a 82 y.o. Asian [place] female with history of chronic Hendricks, hypertension, hyperlipidemia, diabetes mellitus and PAD.  An urgent appointment was made with me due to her colonoscopy/endoscopy being canceled due to markedly elevated blood pressure.  Patient was scheduled for endoscopy however it was canceled due to elevated blood pressure.  An appointment was also made to discuss regarding Plavix.  She underwent peripheral arteriogram and angioplasty to high-grade stenosis of right common iliac artery on 12/29/2022. Patient symptoms of claudication have essentially completely resolved.    Past Medical History:  Diagnosis Date   Arthritis    "some in my" 4th digit right hand  (12/25/2017)   High cholesterol    History of blood transfusion    "related to female bleeding"   Hypertension    PAD (peripheral artery disease) (HCC)    Stroke (HCC) 1998   "fully recovered" (12/25/2017)   Type II diabetes mellitus (HCC)    Past Surgical History:  Procedure Laterality Date   ABDOMINAL AORTOGRAM N/A 12/25/2017   Procedure: ABDOMINAL AORTOGRAM;  Surgeon: Yates Decamp, MD;  Location: [facility];  Service: Cardiovascular;  Laterality: N/A;   ABDOMINAL AORTOGRAM W/LOWER EXTREMITY N/A 12/29/2022   Procedure: ABDOMINAL AORTOGRAM W/LOWER EXTREMITY;  Surgeon: Yates Decamp, MD;  Location: [facility];  Service: Cardiovascular;  Laterality: N/A;   CATARACT EXTRACTION W/ INTRAOCULAR LENS  IMPLANT, BILATERAL Bilateral    CHOLECYSTECTOMY     LOWER EXTREMITY ANGIOGRAPHY Bilateral 12/25/2017   Procedure: LOWER EXTREMITY ANGIOGRAPHY;  Surgeon: Yates Decamp, MD;  Location: [facility];  Service:  Cardiovascular;  Laterality: Bilateral;   PERIPHERAL INTRAVASCULAR LITHOTRIPSY  12/29/2022   Procedure: PERIPHERAL INTRAVASCULAR LITHOTRIPSY;  Surgeon: Yates Decamp, MD;  Location: [facility];  Service: Cardiovascular;;   PERIPHERAL VASCULAR ATHERECTOMY Left 12/25/2017   Procedure: PERIPHERAL VASCULAR ATHERECTOMY;  Surgeon: Yates Decamp, MD;  Location: [facility];  Service: Cardiovascular;  Laterality: Left;  common iliac   PERIPHERAL VASCULAR INTERVENTION Left 12/25/2017   Procedure: PERIPHERAL VASCULAR INTERVENTION;  Surgeon: Yates Decamp, MD;  Location: [facility];  Service: Cardiovascular;  Laterality: Left;  common iliac   PERIPHERAL VASCULAR INTERVENTION  12/29/2022   Procedure: PERIPHERAL VASCULAR INTERVENTION;  Surgeon: Yates Decamp, MD;  Location: [facility];  Service: Cardiovascular;;   THYROID SURGERY  1972   " not sure what was done:"   TONSILLECTOMY     Family History  Problem Relation Age of Onset   Heart attack Father    Social History   Tobacco Use   Smoking status: Never   Smokeless tobacco: Never  Substance Use Topics   Alcohol use: Never  Marital Status: Married    ROS  Review of Systems  Cardiovascular:  Negative for chest pain, claudication, dyspnea on exertion and leg swelling.  Neurological:  Positive for paresthesias (feet and hands).   Objective  Blood pressure 130/66, pulse 69, resp. rate 16, height 4\' 10"  (1.473 m), weight 108 lb 3.2 oz (49.1 kg), SpO2 98%. Body mass index is 22.61 kg/m.      03/14/2023   12:31 PM 03/14/2023   11:58 AM  01/15/2023    1:09 PM  Vitals with BMI  Height  4\' 10"    Weight  108 lbs 3 oz   BMI  22.62   Systolic 130 155 235  Diastolic 66 53 52  Pulse  69 56      Physical Exam Neck:     Vascular: Carotid bruit (bilateral) present. No JVD.  Cardiovascular:     Rate and Rhythm: Normal rate and regular rhythm.     Pulses:          Femoral pulses are 1+ on the right side and 2+ on the left side.       Popliteal pulses are 2+ on the right side and 0 on the left side.       Dorsalis pedis pulses are 2+ on the right side and 0 on the left side.       Posterior tibial pulses are 0 on the right side and 0 on the left side.     Heart sounds: Normal heart sounds. No murmur heard.    No gallop.  Pulmonary:     Effort: Pulmonary effort is normal.     Breath sounds: Normal breath sounds.  Abdominal:     General: Bowel sounds are normal. There is abdominal bruit.     Palpations: Abdomen is soft.  Musculoskeletal:     Right lower leg: No edema.     Left lower leg: No edema.  Skin:    Capillary Refill: Capillary refill takes less than 2 seconds.    Laboratory examination:   External Labs:  Cholesterol, total 140.000 m 02/20/2022 HDL 63.000 mg 02/20/2022 LDL 47.000 mg 02/20/2022 Triglycerides 186.000 m 02/20/2022  A1C 8.100 % 09/01/2022 TSH 3.300 02/20/2022  Hemoglobin 10.400 g/d 02/20/2022 Platelets 210.000 x1 02/20/2022  Creatinine, Serum 0.880 mg/ 02/20/2022 Potassium 5.500 mm 02/20/2022 ALT (SGPT) 26.000 IU/ 02/20/2022   Allergies   No Known Allergies    Final Medications at End of Visit    Current Outpatient Medications:    amLODipine (NORVASC) 10 MG tablet, TAKE ONE TABLET BY MOUTH ONE TIME DAILY (Patient taking differently: Take 10 mg by mouth at bedtime.), Disp: 90 tablet, Rfl: 3   aspirin EC 81 MG tablet, Take 81 mg by mouth daily., Disp: , Rfl:    carbamazepine (TEGRETOL) 100 MG chewable tablet, Chew 50 mg by mouth 2 (two) times daily., Disp: , Rfl:    diclofenac Sodium (VOLTAREN) 1 % GEL, Apply 1 Application topically at bedtime as needed (pain)., Disp: , Rfl:    doxylamine, Sleep, (UNISOM) 25 MG tablet, Take 25 mg by mouth at bedtime., Disp: , Rfl:    folic acid (FOLVITE) 1 MG tablet, Take 1 mg by mouth every evening., Disp: , Rfl:    glipiZIDE (GLUCOTROL) 5 MG tablet, Take 5 mg by mouth 2 (two) times daily., Disp: , Rfl:    hydrALAZINE (APRESOLINE) 50 MG tablet, Take 50 mg  by mouth 3 (three) times daily., Disp: , Rfl:    losartan (COZAAR) 100 MG tablet, Take 100 mg by mouth daily., Disp: , Rfl:    metFORMIN (GLUCOPHAGE-XR) 500 MG 24 hr tablet, Take 500 mg by mouth 2 (two) times daily with a meal., Disp: , Rfl:    Nebivolol HCl 20 MG TABS, Take 1 tablet (20 mg total) by mouth daily., Disp: 90 tablet, Rfl: 3   rosuvastatin (CRESTOR) 20 MG tablet, TAKE 1 TABLET BY MOUTH AT BEDTIME, Disp: 90 tablet, Rfl: 0   vitamin B-12 (CYANOCOBALAMIN) 500  MCG tablet, Take 500 mcg by mouth daily., Disp: , Rfl:    clopidogrel (PLAVIX) 75 MG tablet, Take 1 tablet (75 mg total) by mouth daily. (Patient not taking: Reported on 03/14/2023), Disp: 90 tablet, Rfl: 0   Menthol, Topical Analgesic, (BIOFREEZE EX), Apply 1 Application topically at bedtime as needed (pain)., Disp: , Rfl:    Radiology:  No results found.  Cardiac Studies:   Echo- 04/24/13 1.Left ventricular cavity is normal in size.  Abnormal septal motion due to IVCD.  Lower limits systolic global function.   Calculated EF 55%.   Visual EF is 50-55%.   Doppler evidence of Grade I (impaired) diastolic dysfunction. 2.Left atrial cavity is slightly dilated. 3.Mild calcification of the mitral annulus.   Trace mitral regurgitation.   Mitral valve inflow A > E ratio. 4.Tricuspid valve structurally normal.   Mild tricuspid regurgitation.  Lexiscan Myoview stress test 04/25/2013: 1. Resting EKG NSR, Hendricks. Stress EKG was non diagnostic for ischemia. No ST-T changes of ischemia noted with pharmacologic stress testing. Stress symptoms included chest pressure, sob, stomach discomfort and headache. Stress terminated due to completion of protocol. 2. The perfusion study demonstrated mild breast attenuation artifact in the anterior wall. There was no evidence of ischemia or scar. Dynamic gated images reveal normal wall motion and endocardial thickening. Left ventricular ejection fraction was estimated to be 69%.  This represents a low risk  study.  Carotid artery duplex 08/30/2022: Duplex suggests stenosis in the right internal carotid artery (1-15%). Duplex suggests stenosis in the left internal carotid artery (1-15%). Antegrade right vertebral artery flow. Compared to 06/21/2021, left ICA stenosis of 15-49% has regressed. Further studies if clinically recommended.  Abdominal Aortic Duplex 12/12/2022: Mild plaque noted in the mid aorta.  Mild increase in the abdominal aortic velocity without hemodynamically significant stenosis. Normal multiphasic waveform in the bilateral CIA, mild elevation in the left CIA velocity without significant stenosis.  No significant change fro 09/16/2020.  Lower Extremity Arterial Duplex 12/05/2022:  Dampened monophasic waveform in the right EIA and CFA may suggest  hemodynamically significant proximal (CIA) stenosis.  No hemodynamically significant stenoses are identified in the left lower  extremity arterial system.  This exam reveals mildly decreased perfusion of the right lower extremity,  noted at the dorsalis pedis artery level (ABI 0.95).   Non-compressible ABI on the left, suggestive of medial calcinosis.  Compared to the study done on 09/16/2020, right ABI 0.68 and left ABI 0.65.   Multiphasic waveform was noted in the right and right CFA.  Present study  also suggests patent left common iliac artery stent   Peripheral arteriogram 12/29/2022:  Abdominal aorta shows mild calcification of the abdominal aorta especially distal abdominal aorta.  2 renal arteries on right with mild calcific 20 to 30% stenosis of the superior branch in the midsegment, one renal artery on left, widely patent.   Left common iliac artery 8.0 x 39 mm Viabahn stent placed in 2021 is widely patent.  There is no significant disease in the left SFA.  Three-vessel runoff below the left knee.   Right common iliac artery proximal segment had a 80% calcific stenosis with a 60 mm pressure gradient.  Successful shockwave 8.0  x 60 mm balloon lithotripsy followed by 8.0 x 39 mm covered VBX stent deployed with stenosis reduced to 0%. Right SFA is disease-free and there is three-vessel runoff below the right knee.    Recommendation: Patient will need continued aspirin indefinitely, Plavix for 90 days.  Discharge home  today.  ABI 01/12/2023: This exam reveals normal perfusion of the right lower extremity (ABI 0.98) with mildly abnormal biphasic waveform pattern at the ankle.   This exam reveals mildly decreased perfusion of the left lower extremity, noted at the dorsalis pedis and post tibial artery level (AB 0.89) with mildly abnormal biphasic waveform pattern at the ankle.   Compared to 12/05/2022, left ABI could not be obtained and right ABI was 0.95 with severely abnormal monophasic waveform pattern.  EKG   EKG 10/16/2022: Normal sinus rhythm at the rate of 79 bpm, IVCD, atypical Hendricks.  No significant change from 01/26/2022.  Assessment     ICD-10-CM   1. Primary hypertension  I10     2. PAD (peripheral artery disease) (HCC)  I73.9     3. Pre-op evaluation  Z01.818       No orders of the defined types were placed in this encounter.  Medications Discontinued During This Encounter  Medication Reason   diphenhydrAMINE (BENADRYL) 25 MG tablet    empagliflozin (JARDIANCE) 10 MG TABS tablet    hydrOXYzine (VISTARIL) 100 MG capsule    pantoprazole (PROTONIX) 40 MG tablet    pregabalin (LYRICA) 25 MG capsule      No orders of the defined types were placed in this encounter.   Recommendations:   Mrs. Avah Smethurst is a 82 y.o. Asian [place] female with history of chronic Hendricks, hypertension, hyperlipidemia, diabetes mellitus and PAD.  An urgent appointment was made with me due to her colonoscopy/endoscopy being canceled due to markedly elevated blood pressure.  1. Primary hypertension Blood pressure is well-controlled, I have advised her to take the antihypertensive medications which she did not take  prior to the procedure 2 hours prior, also she can take extra dose of the hydralazine 1 hour prior to the procedure if blood pressure is >150/90 mmHg.  Suspect her anxiety and stress for the procedure and she had to run around to get COVID testing as her husband was positive prior to the procedure lead to anxiety and elevated blood pressure.  2. PAD (peripheral artery disease) (HCC) Since angioplasty to iliac artery, patient states that she has been able to walk without any limitations.  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  Otherwise she remains stable from cardiac standpoint, she is low risk from cardiac standpoint for endoscopy, I will see her back as previously scheduled in 6 months.  She developed urticaria and rash with Plavix, however she completed the course of Plavix hence this has been discontinued.  She is presently on aspirin alone and remains asymptomatic with regard to PAD.  Other orders - hydrALAZINE (APRESOLINE) 50 MG tablet; Take 50 mg by mouth 3 (three) times daily.  3. Pre-op evaluation Medication adjustments have been made, low risk from cardiac standpoint for endoscopic procedures.  She is presently on aspirin 81 mg daily only.   Yates Decamp, MD, [facility] 03/14/2023, 6:09 PM Office: [phone] Pager: [phone]          Charna Elizabeth, MD Laurette Schimke)

## 2023-03-15 DIAGNOSIS — Z1339 Encounter for screening examination for other mental health and behavioral disorders: Secondary | ICD-10-CM | POA: Diagnosis not present

## 2023-03-15 DIAGNOSIS — Z7189 Other specified counseling: Secondary | ICD-10-CM | POA: Diagnosis not present

## 2023-03-15 DIAGNOSIS — R5383 Other fatigue: Secondary | ICD-10-CM | POA: Diagnosis not present

## 2023-03-15 DIAGNOSIS — I10 Essential (primary) hypertension: Secondary | ICD-10-CM | POA: Diagnosis not present

## 2023-03-15 DIAGNOSIS — Z0000 Encounter for general adult medical examination without abnormal findings: Secondary | ICD-10-CM | POA: Diagnosis not present

## 2023-03-15 DIAGNOSIS — Z79899 Other long term (current) drug therapy: Secondary | ICD-10-CM | POA: Diagnosis not present

## 2023-03-15 DIAGNOSIS — E7800 Pure hypercholesterolemia, unspecified: Secondary | ICD-10-CM | POA: Diagnosis not present

## 2023-03-15 DIAGNOSIS — Z1331 Encounter for screening for depression: Secondary | ICD-10-CM | POA: Diagnosis not present

## 2023-03-15 DIAGNOSIS — Z299 Encounter for prophylactic measures, unspecified: Secondary | ICD-10-CM | POA: Diagnosis not present

## 2023-03-20 DIAGNOSIS — E119 Type 2 diabetes mellitus without complications: Secondary | ICD-10-CM | POA: Diagnosis not present

## 2023-03-20 DIAGNOSIS — D509 Iron deficiency anemia, unspecified: Secondary | ICD-10-CM | POA: Diagnosis not present

## 2023-03-20 DIAGNOSIS — Z85038 Personal history of other malignant neoplasm of large intestine: Secondary | ICD-10-CM | POA: Diagnosis not present

## 2023-03-20 DIAGNOSIS — K219 Gastro-esophageal reflux disease without esophagitis: Secondary | ICD-10-CM | POA: Diagnosis not present

## 2023-03-20 DIAGNOSIS — I10 Essential (primary) hypertension: Secondary | ICD-10-CM | POA: Diagnosis not present

## 2023-03-21 DIAGNOSIS — D509 Iron deficiency anemia, unspecified: Secondary | ICD-10-CM | POA: Diagnosis not present

## 2023-03-21 DIAGNOSIS — K5140 Inflammatory polyps of colon without complications: Secondary | ICD-10-CM | POA: Diagnosis not present

## 2023-03-21 DIAGNOSIS — Z980 Intestinal bypass and anastomosis status: Secondary | ICD-10-CM | POA: Diagnosis not present

## 2023-03-21 DIAGNOSIS — K219 Gastro-esophageal reflux disease without esophagitis: Secondary | ICD-10-CM | POA: Diagnosis not present

## 2023-03-21 DIAGNOSIS — K2970 Gastritis, unspecified, without bleeding: Secondary | ICD-10-CM | POA: Diagnosis not present

## 2023-03-21 DIAGNOSIS — K6389 Other specified diseases of intestine: Secondary | ICD-10-CM | POA: Diagnosis not present

## 2023-03-21 DIAGNOSIS — K2950 Unspecified chronic gastritis without bleeding: Secondary | ICD-10-CM | POA: Diagnosis not present

## 2023-03-21 DIAGNOSIS — Z1211 Encounter for screening for malignant neoplasm of colon: Secondary | ICD-10-CM | POA: Diagnosis not present

## 2023-03-21 DIAGNOSIS — B9681 Helicobacter pylori [H. pylori] as the cause of diseases classified elsewhere: Secondary | ICD-10-CM | POA: Diagnosis not present

## 2023-03-21 DIAGNOSIS — Z85038 Personal history of other malignant neoplasm of large intestine: Secondary | ICD-10-CM | POA: Diagnosis not present

## 2023-03-24 ENCOUNTER — Telehealth: Payer: Self-pay | Admitting: Gastroenterology

## 2023-03-24 DIAGNOSIS — E1169 Type 2 diabetes mellitus with other specified complication: Secondary | ICD-10-CM | POA: Diagnosis not present

## 2023-03-24 NOTE — Telephone Encounter (Signed)
Pharmacy called reporting note wanted to make sure patient got all 4 medications but only 3 were on file.  Pharmacist states he only had omeprazole, doxycycline, and metronidazole.  He states this is for H. pylori.  Unable to see Dr. Kenna Gilbert note.  Assume fourth medication is bismuth subsalicylate.  Recommended to fill bismuth subsalicylate 4 times daily for duration of other medications. Verbal pharmacy order provided.

## 2023-03-28 ENCOUNTER — Ambulatory Visit: Payer: Medicare Other

## 2023-03-28 DIAGNOSIS — I739 Peripheral vascular disease, unspecified: Secondary | ICD-10-CM

## 2023-04-01 NOTE — Progress Notes (Signed)
Leg circulation has now improved and stable  ABI 03/28/2023: This exam reveals normal perfusion of the right lower extremity (ABI 1.05 with normal triphasic waveform at the ankle).   This exam reveals normal perfusion of the left lower extremity (ABI 1.05 with mildly abnormal biphasic waveform pattern at the ankle).  Compared to the study done on 01/15/2023, right ABI was 0.98 and left ABI mildly reduced at 0.89.

## 2023-04-02 NOTE — Progress Notes (Signed)
Not sure, probably neuropathy from diabetes?

## 2023-04-02 NOTE — Progress Notes (Signed)
Called patient and husband answered. Informed him about patient ABI results. Patient husband mention that she is still having pain in her left leg and would like to see is there anything else that you can order to see why she is having leg pain

## 2023-04-05 DIAGNOSIS — I779 Disorder of arteries and arterioles, unspecified: Secondary | ICD-10-CM | POA: Diagnosis not present

## 2023-04-05 DIAGNOSIS — G9519 Other vascular myelopathies: Secondary | ICD-10-CM | POA: Diagnosis not present

## 2023-04-05 DIAGNOSIS — Z299 Encounter for prophylactic measures, unspecified: Secondary | ICD-10-CM | POA: Diagnosis not present

## 2023-04-05 DIAGNOSIS — E1165 Type 2 diabetes mellitus with hyperglycemia: Secondary | ICD-10-CM | POA: Diagnosis not present

## 2023-04-05 DIAGNOSIS — I10 Essential (primary) hypertension: Secondary | ICD-10-CM | POA: Diagnosis not present

## 2023-04-05 DIAGNOSIS — F0390 Unspecified dementia without behavioral disturbance: Secondary | ICD-10-CM | POA: Diagnosis not present

## 2023-04-09 ENCOUNTER — Encounter: Payer: Self-pay | Admitting: Cardiology

## 2023-04-09 NOTE — Progress Notes (Signed)
Hemorrhagic gastritis in the fundus 04/09/23

## 2023-04-10 NOTE — Progress Notes (Signed)
Called patient, NA, LMAM

## 2023-04-12 NOTE — Progress Notes (Signed)
Called patient no answer left a vm

## 2023-04-12 NOTE — Progress Notes (Signed)
2nd attempt : Called patient, NA, LMAM

## 2023-04-13 DIAGNOSIS — E162 Hypoglycemia, unspecified: Secondary | ICD-10-CM | POA: Diagnosis not present

## 2023-04-13 DIAGNOSIS — E1140 Type 2 diabetes mellitus with diabetic neuropathy, unspecified: Secondary | ICD-10-CM | POA: Diagnosis not present

## 2023-04-13 DIAGNOSIS — R5383 Other fatigue: Secondary | ICD-10-CM | POA: Diagnosis not present

## 2023-04-17 ENCOUNTER — Other Ambulatory Visit: Payer: Medicare Other

## 2023-04-19 DIAGNOSIS — B9681 Helicobacter pylori [H. pylori] as the cause of diseases classified elsewhere: Secondary | ICD-10-CM | POA: Diagnosis not present

## 2023-04-19 DIAGNOSIS — K219 Gastro-esophageal reflux disease without esophagitis: Secondary | ICD-10-CM | POA: Diagnosis not present

## 2023-04-19 DIAGNOSIS — Z85038 Personal history of other malignant neoplasm of large intestine: Secondary | ICD-10-CM | POA: Diagnosis not present

## 2023-04-19 DIAGNOSIS — D509 Iron deficiency anemia, unspecified: Secondary | ICD-10-CM | POA: Diagnosis not present

## 2023-04-21 ENCOUNTER — Other Ambulatory Visit: Payer: Self-pay | Admitting: Cardiology

## 2023-04-21 DIAGNOSIS — E782 Mixed hyperlipidemia: Secondary | ICD-10-CM

## 2023-04-23 DIAGNOSIS — E1169 Type 2 diabetes mellitus with other specified complication: Secondary | ICD-10-CM | POA: Diagnosis not present

## 2023-05-23 DIAGNOSIS — E1169 Type 2 diabetes mellitus with other specified complication: Secondary | ICD-10-CM | POA: Diagnosis not present

## 2023-07-11 ENCOUNTER — Ambulatory Visit: Payer: Medicare Other | Admitting: Cardiology

## 2023-07-31 DIAGNOSIS — I779 Disorder of arteries and arterioles, unspecified: Secondary | ICD-10-CM | POA: Diagnosis not present

## 2023-07-31 DIAGNOSIS — G9519 Other vascular myelopathies: Secondary | ICD-10-CM | POA: Diagnosis not present

## 2023-07-31 DIAGNOSIS — E1165 Type 2 diabetes mellitus with hyperglycemia: Secondary | ICD-10-CM | POA: Diagnosis not present

## 2023-07-31 DIAGNOSIS — Z299 Encounter for prophylactic measures, unspecified: Secondary | ICD-10-CM | POA: Diagnosis not present

## 2023-07-31 DIAGNOSIS — I10 Essential (primary) hypertension: Secondary | ICD-10-CM | POA: Diagnosis not present

## 2023-07-31 DIAGNOSIS — E1140 Type 2 diabetes mellitus with diabetic neuropathy, unspecified: Secondary | ICD-10-CM | POA: Diagnosis not present

## 2023-09-17 ENCOUNTER — Ambulatory Visit: Payer: Medicare Other | Attending: Cardiology | Admitting: Cardiology

## 2023-09-17 ENCOUNTER — Encounter: Payer: Self-pay | Admitting: Cardiology

## 2023-09-17 VITALS — BP 174/76 | HR 75 | Resp 16 | Ht <= 58 in | Wt 107.8 lb

## 2023-09-17 DIAGNOSIS — E782 Mixed hyperlipidemia: Secondary | ICD-10-CM | POA: Insufficient documentation

## 2023-09-17 DIAGNOSIS — I10 Essential (primary) hypertension: Secondary | ICD-10-CM | POA: Insufficient documentation

## 2023-09-17 DIAGNOSIS — I739 Peripheral vascular disease, unspecified: Secondary | ICD-10-CM | POA: Diagnosis not present

## 2023-09-17 NOTE — Patient Instructions (Signed)

## 2023-09-17 NOTE — Progress Notes (Signed)
 Cardiology Office Note:  .   Date:  09/17/2023  ID:  Khylie, Larmore 07-11-1941, MRN [id number] PCP: Ignatius Specking, MD  [facility] Providers Cardiologist:  Yates Decamp, MD   History of Present Illness: .   Brianna Hendricks is a 83 y.o. Asian [place] female with history of chronic LBBB, hypertension, hyperlipidemia, diabetes mellitus and PAD with history of left CIA covered stent placed in 2021, right CIA angioplasty on 12/29/2022 with resolution of symptoms of claudication.   Discussed the use of AI scribe software for clinical note transcription with the patient, who gave verbal consent to proceed.  History of Present Illness   The patient, with a history of hypertension and diabetes, presents for a routine follow-up. The patient's son, who assists with her care, expresses concern about her current medication regimen. She notes that the patient should be taking amlodipine and losartan for her hypertension, but she is unsure if she is currently taking these medications. The patient is unsure about her current medications, stating that some were stopped after her husband's passing. She also mentions occasional pain in her left leg, which she has had for a long time. The patient lives with her son and his family, and she reports limited physical activity, mostly staying at home and walking a little bit.      Labs   Lab Results  Component Value Date   CHOL 101 07/26/2021   HDL 47 07/26/2021   LDLCALC 34 07/26/2021   TRIG 112 07/26/2021   Lab Results  Component Value Date   NA 139 12/29/2022   K 4.3 12/29/2022   CO2 23 02/26/2007   GLUCOSE 278 (H) 12/29/2022   BUN 18 12/29/2022   CREATININE 0.90 12/29/2022   CALCIUM 9.3 02/26/2007      Latest Ref Rng & Units 12/29/2022    9:53 AM 12/25/2017    6:58 AM 02/26/2007    2:53 PM  BMP  Glucose 70 - 99 mg/dL 409   811   BUN 8 - 23 mg/dL 18   14   Creatinine 9.14 - 1.00 mg/dL 7.82   9.56   Sodium 213 - 145  mmol/L 139   135   Potassium 3.5 - 5.1 mmol/L 4.3  4.0  4.0   Chloride 98 - 111 mmol/L 106   99   CO2 19 - 32 mEq/L   23   Calcium 8.4 - 10.5 mg/dL   9.3       Latest Ref Rng & Units 12/29/2022    9:53 AM 02/26/2007    2:53 PM  CBC  WBC 3.9 - 10.0 10e3/uL  6.9   Hemoglobin 12.0 - 15.0 g/dL 9.9  08.6   Hematocrit 36.0 - 46.0 % 29.0  32.6   Platelets 145 - 400 10e3/uL  236    Review of Systems  Cardiovascular:  Negative for chest pain, dyspnea on exertion and leg swelling.   Physical Exam:   VS:  BP (!) 174/76 (BP Location: Left Arm, Patient Position: Sitting, Cuff Size: Normal)   Pulse 75   Resp 16   Ht 4\' 10"  (1.473 m)   Wt 107 lb 12.8 oz (48.9 kg)   SpO2 98%   BMI 22.53 kg/m    Wt Readings from Last 3 Encounters:  09/17/23 107 lb 12.8 oz (48.9 kg)  03/14/23 108 lb 3.2 oz (49.1 kg)  01/15/23 106 lb 3.2 oz (48.2 kg)    Physical Exam Neck:  Vascular: Carotid bruit (left carotid soft bruit) present. No JVD.  Cardiovascular:     Rate and Rhythm: Normal rate and regular rhythm.     Pulses: Intact distal pulses.     Heart sounds: Normal heart sounds. No murmur heard.    No gallop.  Pulmonary:     Effort: Pulmonary effort is normal.     Breath sounds: Normal breath sounds.  Abdominal:     General: Bowel sounds are normal.     Palpations: Abdomen is soft.  Musculoskeletal:     Right lower leg: No edema.     Left lower leg: No edema.    Studies Reviewed: .    Carotid artery duplex 08/30/2022: Duplex suggests stenosis in the right internal carotid artery (1-15%). Duplex suggests stenosis in the left internal carotid artery (1-15%). Antegrade right vertebral artery flow. Compared to 06/21/2021, left ICA stenosis of 15-49% has regressed. Further studies if clinically recommended.  ABI 01/12/2023: This exam reveals normal perfusion of the right lower extremity (ABI 0.98) with mildly abnormal biphasic waveform pattern at the ankle.   This exam reveals mildly decreased  perfusion of the left lower extremity, noted at the dorsalis pedis and post tibial artery level (AB 0.89) with mildly abnormal biphasic waveform pattern at the ankle.   Compared to 12/05/2022, left ABI could not be obtained and right ABI was 0.95 with severely abnormal monophasic waveform pattern.  EKG:    EKG Interpretation Date/Time:  Monday September 17 2023 14:33:23 EST Ventricular Rate:  75 PR Interval:  160 QRS Duration:  132 QT Interval:  426 QTC Calculation: 475 R Axis:   -55  Text Interpretation: EKG 09/17/2023: Normal sinus rhythm at the rate of 75 bpm left anterior fascicular block.  Atypical left bundle branch block.  Consider LVH.  No significant change from 10/16/2022. Confirmed by Delrae Rend [id number]) on 09/17/2023 2:45:34 PM    EKG 10/16/2022: Normal sinus rhythm at the rate of 79 bpm, IVCD, atypical LBBB.   Medications and allergies    No Known Allergies   Current Outpatient Medications:    aspirin EC 81 MG tablet, Take 81 mg by mouth daily., Disp: , Rfl:    carbamazepine (TEGRETOL) 100 MG chewable tablet, Chew 50 mg by mouth 2 (two) times daily., Disp: , Rfl:    diclofenac Sodium (VOLTAREN) 1 % GEL, Apply 1 Application topically at bedtime as needed (pain)., Disp: , Rfl:    doxylamine, Sleep, (UNISOM) 25 MG tablet, Take 25 mg by mouth at bedtime., Disp: , Rfl:    ferrous sulfate 324 MG TBEC, Take 324 mg by mouth., Disp: , Rfl:    folic acid (FOLVITE) 1 MG tablet, Take 1 mg by mouth every evening., Disp: , Rfl:    glipiZIDE (GLUCOTROL) 5 MG tablet, Take 5 mg by mouth 2 (two) times daily., Disp: , Rfl:    Menthol, Topical Analgesic, (BIOFREEZE EX), Apply 1 Application topically at bedtime as needed (pain)., Disp: , Rfl:    metFORMIN (GLUCOPHAGE-XR) 500 MG 24 hr tablet, Take 500 mg by mouth 2 (two) times daily with a meal., Disp: , Rfl:    pregabalin (LYRICA) 25 MG capsule, Take 25 mg by mouth at bedtime., Disp: , Rfl:    rosuvastatin (CRESTOR) 20 MG tablet, TAKE 1  TABLET BY MOUTH AT BEDTIME, Disp: 90 tablet, Rfl: 0   vitamin B-12 (CYANOCOBALAMIN) 500 MCG tablet, Take 500 mcg by mouth daily., Disp: , Rfl:    amLODipine (NORVASC) 10 MG tablet, TAKE ONE  TABLET BY MOUTH ONE TIME DAILY (Patient taking differently: Take 10 mg by mouth at bedtime.), Disp: 90 tablet, Rfl: 3   losartan (COZAAR) 100 MG tablet, Take 100 mg by mouth daily., Disp: , Rfl:    ASSESSMENT AND PLAN: .      ICD-10-CM   1. PAD (peripheral artery disease) (HCC)  I73.9 EKG 12-Lead    2. Primary hypertension  I10     3. Mixed hyperlipidemia  E78.2       Assessment and Plan    Hypertension Hypertension was previously well-controlled with amlodipine, hydralazine, losartan, and nebivolol. Currently, only amlodipine and losartan are being taken, which may be insufficient. There is uncertainty about the current regimen. The importance of blood pressure management was discussed.   Verify the home medication regimen, specifically checking for amlodipine 10 mg daily and losartan 100 mg daily, and contact the doctor with the verified list. Consider reintroducing hydralazine 50 mg 3 times daily and nebivolol 20 mg daily if no significant side effects were noted previously. Sign up for MyChart for easier communication and medication management. Schedule an appointment with Dr. Doreen Beam for further management.  Peripheral arterial disease and faint left carotid bruit From a PAD standpoint she has remained stable and she has excellent pedal pulses.  She does have bilateral left leg worse on the right tingling and numbness and discomfort which is more indicator of peripheral neuropathy from diabetes mellitus.  Presently on aspirin 81 mg daily.  Continue the same.  She has had carotid artery duplex in the past which did not reveal any high-grade stenosis.  No further evaluation is indicated with regard to carotid bruit.  Diabetic Peripheral Neuropathy Intermittent left leg pain is attributed to  diabetic peripheral neuropathy, likely related to diabetes. Manage diabetes to prevent worsening of neuropathy.  General Health Maintenance Encouraged to stay active and get sunlight exposure. Consider a mental health evaluation due to recent bereavement. Encourage walking and physical activity, and spending time outdoors in sunlight. Discuss the possibility of antidepressants with Dr. Doreen Beam.  Her lipids are also well-controlled presently on high intensity statin with Crestor 20 mg daily.  Follow-up Schedule an appointment with Dr. Sheppard Evens for hypertension management and follow up with the cardiologist in one year.           Signed,  Yates Decamp, MD, [facility] 09/17/2023, 3:21 PM [facility] [address] [place] [address] [place], [place] [address] Phone: [phone]. Fax:  [phone]

## 2023-09-27 ENCOUNTER — Other Ambulatory Visit: Payer: Self-pay | Admitting: Cardiology

## 2023-09-27 DIAGNOSIS — I10 Essential (primary) hypertension: Secondary | ICD-10-CM

## 2023-10-03 DIAGNOSIS — M79605 Pain in left leg: Secondary | ICD-10-CM | POA: Diagnosis not present

## 2023-10-03 DIAGNOSIS — Z299 Encounter for prophylactic measures, unspecified: Secondary | ICD-10-CM | POA: Diagnosis not present

## 2023-10-03 DIAGNOSIS — I10 Essential (primary) hypertension: Secondary | ICD-10-CM | POA: Diagnosis not present

## 2023-10-03 DIAGNOSIS — I739 Peripheral vascular disease, unspecified: Secondary | ICD-10-CM | POA: Diagnosis not present

## 2023-10-03 DIAGNOSIS — R569 Unspecified convulsions: Secondary | ICD-10-CM | POA: Diagnosis not present

## 2023-10-03 DIAGNOSIS — F0390 Unspecified dementia without behavioral disturbance: Secondary | ICD-10-CM | POA: Diagnosis not present

## 2023-11-15 DIAGNOSIS — E1140 Type 2 diabetes mellitus with diabetic neuropathy, unspecified: Secondary | ICD-10-CM | POA: Diagnosis not present

## 2023-11-15 DIAGNOSIS — I10 Essential (primary) hypertension: Secondary | ICD-10-CM | POA: Diagnosis not present

## 2023-11-15 DIAGNOSIS — Z299 Encounter for prophylactic measures, unspecified: Secondary | ICD-10-CM | POA: Diagnosis not present

## 2023-11-15 DIAGNOSIS — E1165 Type 2 diabetes mellitus with hyperglycemia: Secondary | ICD-10-CM | POA: Diagnosis not present

## 2024-02-04 DIAGNOSIS — I10 Essential (primary) hypertension: Secondary | ICD-10-CM | POA: Diagnosis not present

## 2024-02-04 DIAGNOSIS — R070 Pain in throat: Secondary | ICD-10-CM | POA: Diagnosis not present

## 2024-02-04 DIAGNOSIS — R300 Dysuria: Secondary | ICD-10-CM | POA: Diagnosis not present

## 2024-02-04 DIAGNOSIS — Z299 Encounter for prophylactic measures, unspecified: Secondary | ICD-10-CM | POA: Diagnosis not present

## 2024-02-21 DIAGNOSIS — E1169 Type 2 diabetes mellitus with other specified complication: Secondary | ICD-10-CM | POA: Diagnosis not present

## 2024-02-25 ENCOUNTER — Other Ambulatory Visit: Payer: Self-pay | Admitting: Cardiology

## 2024-02-25 DIAGNOSIS — I10 Essential (primary) hypertension: Secondary | ICD-10-CM

## 2024-02-27 DIAGNOSIS — Z299 Encounter for prophylactic measures, unspecified: Secondary | ICD-10-CM | POA: Diagnosis not present

## 2024-02-27 DIAGNOSIS — E119 Type 2 diabetes mellitus without complications: Secondary | ICD-10-CM | POA: Diagnosis not present

## 2024-02-27 DIAGNOSIS — R300 Dysuria: Secondary | ICD-10-CM | POA: Diagnosis not present

## 2024-02-27 DIAGNOSIS — N390 Urinary tract infection, site not specified: Secondary | ICD-10-CM | POA: Diagnosis not present

## 2024-02-27 DIAGNOSIS — I10 Essential (primary) hypertension: Secondary | ICD-10-CM | POA: Diagnosis not present

## 2024-03-14 DIAGNOSIS — H31092 Other chorioretinal scars, left eye: Secondary | ICD-10-CM | POA: Diagnosis not present

## 2024-03-14 DIAGNOSIS — H179 Unspecified corneal scar and opacity: Secondary | ICD-10-CM | POA: Diagnosis not present

## 2024-03-14 DIAGNOSIS — E119 Type 2 diabetes mellitus without complications: Secondary | ICD-10-CM | POA: Diagnosis not present

## 2024-03-14 DIAGNOSIS — H18513 Endothelial corneal dystrophy, bilateral: Secondary | ICD-10-CM | POA: Diagnosis not present

## 2024-03-14 DIAGNOSIS — R600 Localized edema: Secondary | ICD-10-CM | POA: Diagnosis not present

## 2024-03-14 DIAGNOSIS — E1140 Type 2 diabetes mellitus with diabetic neuropathy, unspecified: Secondary | ICD-10-CM | POA: Diagnosis not present

## 2024-03-14 DIAGNOSIS — R300 Dysuria: Secondary | ICD-10-CM | POA: Diagnosis not present

## 2024-03-26 DIAGNOSIS — E1165 Type 2 diabetes mellitus with hyperglycemia: Secondary | ICD-10-CM | POA: Diagnosis not present

## 2024-03-26 DIAGNOSIS — E7800 Pure hypercholesterolemia, unspecified: Secondary | ICD-10-CM | POA: Diagnosis not present

## 2024-03-26 DIAGNOSIS — Z1331 Encounter for screening for depression: Secondary | ICD-10-CM | POA: Diagnosis not present

## 2024-03-26 DIAGNOSIS — Z1339 Encounter for screening examination for other mental health and behavioral disorders: Secondary | ICD-10-CM | POA: Diagnosis not present

## 2024-03-26 DIAGNOSIS — Z7189 Other specified counseling: Secondary | ICD-10-CM | POA: Diagnosis not present

## 2024-03-26 DIAGNOSIS — I10 Essential (primary) hypertension: Secondary | ICD-10-CM | POA: Diagnosis not present

## 2024-03-26 DIAGNOSIS — Z299 Encounter for prophylactic measures, unspecified: Secondary | ICD-10-CM | POA: Diagnosis not present

## 2024-03-26 DIAGNOSIS — Z0000 Encounter for general adult medical examination without abnormal findings: Secondary | ICD-10-CM | POA: Diagnosis not present

## 2024-03-26 DIAGNOSIS — R5383 Other fatigue: Secondary | ICD-10-CM | POA: Diagnosis not present

## 2024-04-04 DIAGNOSIS — Z79899 Other long term (current) drug therapy: Secondary | ICD-10-CM | POA: Diagnosis not present

## 2024-04-04 DIAGNOSIS — R5383 Other fatigue: Secondary | ICD-10-CM | POA: Diagnosis not present

## 2024-04-04 DIAGNOSIS — E7800 Pure hypercholesterolemia, unspecified: Secondary | ICD-10-CM | POA: Diagnosis not present

## 2024-05-30 DIAGNOSIS — I10 Essential (primary) hypertension: Secondary | ICD-10-CM | POA: Diagnosis not present

## 2024-05-30 DIAGNOSIS — N3020 Other chronic cystitis without hematuria: Secondary | ICD-10-CM | POA: Diagnosis not present

## 2024-05-30 DIAGNOSIS — R52 Pain, unspecified: Secondary | ICD-10-CM | POA: Diagnosis not present

## 2024-05-30 DIAGNOSIS — R3915 Urgency of urination: Secondary | ICD-10-CM | POA: Diagnosis not present

## 2024-05-30 DIAGNOSIS — G5602 Carpal tunnel syndrome, left upper limb: Secondary | ICD-10-CM | POA: Diagnosis not present

## 2024-05-30 DIAGNOSIS — F0390 Unspecified dementia without behavioral disturbance: Secondary | ICD-10-CM | POA: Diagnosis not present

## 2024-05-30 DIAGNOSIS — E119 Type 2 diabetes mellitus without complications: Secondary | ICD-10-CM | POA: Diagnosis not present

## 2024-05-30 DIAGNOSIS — N958 Other specified menopausal and perimenopausal disorders: Secondary | ICD-10-CM | POA: Diagnosis not present

## 2024-05-30 DIAGNOSIS — Z299 Encounter for prophylactic measures, unspecified: Secondary | ICD-10-CM | POA: Diagnosis not present

## 2024-06-30 DIAGNOSIS — R301 Vesical tenesmus: Secondary | ICD-10-CM | POA: Diagnosis not present

## 2024-06-30 DIAGNOSIS — N958 Other specified menopausal and perimenopausal disorders: Secondary | ICD-10-CM | POA: Diagnosis not present
